# Patient Record
Sex: Female | Born: 1960
Health system: Southern US, Community
[De-identification: ages and names within clinical notes are randomized; demographics above are authoritative.]

## PROBLEM LIST (undated history)

## (undated) DIAGNOSIS — O1420 HELLP syndrome (HELLP), unspecified trimester: Secondary | ICD-10-CM

## (undated) DIAGNOSIS — G8929 Other chronic pain: Secondary | ICD-10-CM

## (undated) DIAGNOSIS — D497 Neoplasm of unspecified behavior of endocrine glands and other parts of nervous system: Secondary | ICD-10-CM

## (undated) DIAGNOSIS — Z9889 Other specified postprocedural states: Secondary | ICD-10-CM

## (undated) DIAGNOSIS — R109 Unspecified abdominal pain: Secondary | ICD-10-CM

## (undated) HISTORY — PX: CHOLECYSTECTOMY: SHX55

## (undated) HISTORY — DX: Other specified postprocedural states: Z98.890

## (undated) HISTORY — DX: Unspecified abdominal pain: R10.9

## (undated) HISTORY — PX: OTHER SURGICAL HISTORY: SHX169

## (undated) HISTORY — PX: TONSILLECTOMY: SUR1361

## (undated) HISTORY — DX: HELLP syndrome (HELLP), unspecified trimester: O14.20

## (undated) HISTORY — DX: Other chronic pain: G89.29

---

## 1998-10-06 ENCOUNTER — Encounter: Payer: Self-pay | Admitting: Internal Medicine

## 1998-10-06 ENCOUNTER — Emergency Department (HOSPITAL_COMMUNITY): Admission: EM | Admit: 1998-10-06 | Discharge: 1998-10-06 | Payer: Self-pay | Admitting: Internal Medicine

## 2000-01-25 ENCOUNTER — Emergency Department (HOSPITAL_COMMUNITY): Admission: EM | Admit: 2000-01-25 | Discharge: 2000-01-25 | Payer: Self-pay | Admitting: Emergency Medicine

## 2000-09-11 ENCOUNTER — Ambulatory Visit (HOSPITAL_COMMUNITY): Admission: RE | Admit: 2000-09-11 | Discharge: 2000-09-11 | Payer: Self-pay | Admitting: Internal Medicine

## 2000-12-17 ENCOUNTER — Emergency Department (HOSPITAL_COMMUNITY): Admission: EM | Admit: 2000-12-17 | Discharge: 2000-12-17 | Payer: Self-pay | Admitting: *Deleted

## 2000-12-17 ENCOUNTER — Encounter: Payer: Self-pay | Admitting: *Deleted

## 2000-12-19 ENCOUNTER — Ambulatory Visit (HOSPITAL_COMMUNITY): Admission: RE | Admit: 2000-12-19 | Discharge: 2000-12-19 | Payer: Self-pay | Admitting: General Surgery

## 2001-05-13 ENCOUNTER — Ambulatory Visit (HOSPITAL_COMMUNITY): Admission: RE | Admit: 2001-05-13 | Discharge: 2001-05-13 | Payer: Self-pay | Admitting: Internal Medicine

## 2001-05-13 ENCOUNTER — Encounter: Payer: Self-pay | Admitting: Internal Medicine

## 2001-05-16 ENCOUNTER — Encounter: Admission: RE | Admit: 2001-05-16 | Discharge: 2001-08-14 | Payer: Self-pay | Admitting: Internal Medicine

## 2002-06-06 ENCOUNTER — Encounter: Payer: Self-pay | Admitting: Internal Medicine

## 2002-06-06 ENCOUNTER — Ambulatory Visit (HOSPITAL_COMMUNITY): Admission: RE | Admit: 2002-06-06 | Discharge: 2002-06-06 | Payer: Self-pay | Admitting: Internal Medicine

## 2002-06-12 ENCOUNTER — Ambulatory Visit (HOSPITAL_COMMUNITY): Admission: RE | Admit: 2002-06-12 | Discharge: 2002-06-12 | Payer: Self-pay | Admitting: Internal Medicine

## 2002-06-12 ENCOUNTER — Encounter: Payer: Self-pay | Admitting: Internal Medicine

## 2002-09-11 ENCOUNTER — Ambulatory Visit (HOSPITAL_COMMUNITY): Admission: RE | Admit: 2002-09-11 | Discharge: 2002-09-11 | Payer: Self-pay | Admitting: Internal Medicine

## 2002-09-11 ENCOUNTER — Encounter: Payer: Self-pay | Admitting: Internal Medicine

## 2003-01-28 ENCOUNTER — Emergency Department (HOSPITAL_COMMUNITY): Admission: EM | Admit: 2003-01-28 | Discharge: 2003-01-28 | Payer: Self-pay | Admitting: Emergency Medicine

## 2003-04-07 ENCOUNTER — Other Ambulatory Visit: Admission: RE | Admit: 2003-04-07 | Discharge: 2003-04-07 | Payer: Self-pay | Admitting: Gynecology

## 2003-07-11 ENCOUNTER — Inpatient Hospital Stay (HOSPITAL_COMMUNITY): Admission: AD | Admit: 2003-07-11 | Discharge: 2003-07-15 | Payer: Self-pay | Admitting: Gynecology

## 2003-07-11 ENCOUNTER — Encounter (INDEPENDENT_AMBULATORY_CARE_PROVIDER_SITE_OTHER): Payer: Self-pay | Admitting: *Deleted

## 2003-07-16 ENCOUNTER — Encounter: Admission: RE | Admit: 2003-07-16 | Discharge: 2003-08-15 | Payer: Self-pay | Admitting: Gynecology

## 2003-08-09 ENCOUNTER — Inpatient Hospital Stay (HOSPITAL_COMMUNITY): Admission: AD | Admit: 2003-08-09 | Discharge: 2003-08-09 | Payer: Self-pay | Admitting: Gynecology

## 2003-08-16 ENCOUNTER — Encounter: Admission: RE | Admit: 2003-08-16 | Discharge: 2003-09-15 | Payer: Self-pay | Admitting: Gynecology

## 2003-09-03 ENCOUNTER — Other Ambulatory Visit: Admission: RE | Admit: 2003-09-03 | Discharge: 2003-09-03 | Payer: Self-pay | Admitting: Gynecology

## 2003-09-16 ENCOUNTER — Encounter: Admission: RE | Admit: 2003-09-16 | Discharge: 2003-10-16 | Payer: Self-pay | Admitting: Gynecology

## 2003-11-16 ENCOUNTER — Encounter: Admission: RE | Admit: 2003-11-16 | Discharge: 2003-12-16 | Payer: Self-pay | Admitting: Gynecology

## 2004-02-11 ENCOUNTER — Ambulatory Visit (HOSPITAL_COMMUNITY): Admission: RE | Admit: 2004-02-11 | Discharge: 2004-02-11 | Payer: Self-pay | Admitting: Internal Medicine

## 2004-06-07 ENCOUNTER — Ambulatory Visit (HOSPITAL_COMMUNITY): Admission: RE | Admit: 2004-06-07 | Discharge: 2004-06-07 | Payer: Self-pay | Admitting: Internal Medicine

## 2004-08-25 ENCOUNTER — Ambulatory Visit (HOSPITAL_COMMUNITY): Admission: RE | Admit: 2004-08-25 | Discharge: 2004-08-25 | Payer: Self-pay | Admitting: Internal Medicine

## 2005-01-02 DIAGNOSIS — Z9889 Other specified postprocedural states: Secondary | ICD-10-CM

## 2005-01-02 HISTORY — DX: Other specified postprocedural states: Z98.890

## 2005-02-04 ENCOUNTER — Inpatient Hospital Stay (HOSPITAL_COMMUNITY): Admission: EM | Admit: 2005-02-04 | Discharge: 2005-02-08 | Payer: Self-pay | Admitting: Emergency Medicine

## 2005-02-14 ENCOUNTER — Ambulatory Visit: Payer: Self-pay | Admitting: Internal Medicine

## 2005-02-17 ENCOUNTER — Ambulatory Visit (HOSPITAL_COMMUNITY): Admission: RE | Admit: 2005-02-17 | Discharge: 2005-02-17 | Payer: Self-pay | Admitting: Internal Medicine

## 2005-02-28 ENCOUNTER — Ambulatory Visit (HOSPITAL_COMMUNITY): Admission: RE | Admit: 2005-02-28 | Discharge: 2005-02-28 | Payer: Self-pay | Admitting: Internal Medicine

## 2005-03-08 ENCOUNTER — Ambulatory Visit: Payer: Self-pay | Admitting: Internal Medicine

## 2006-08-26 IMAGING — CT CT PELVIS W/ CM
1 of 3 series · 14 of 32 positions shown, 19 images · IV contrast (CONTRAST)
Comparison: none

HISTORY: Right-sided abdominal pain, nausea, altered bowel habits

[Series 4719: — · axial · 0.65mm/px · z∈[+1118,+1504]mm · 14 of 89 slices shown, 19 images]
[im 6/89  soft-tissue]
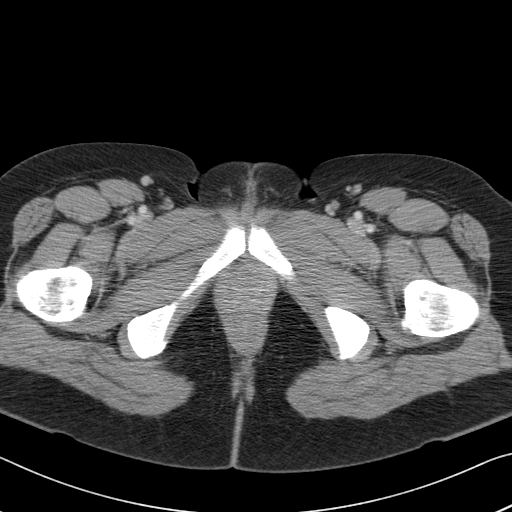
[im 6/89  bone]
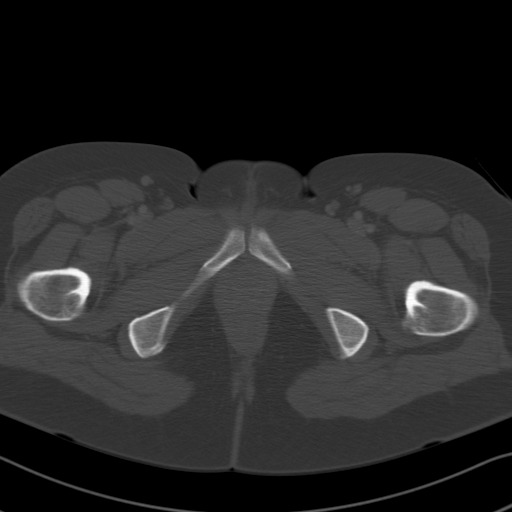
[im 11/89  soft-tissue]
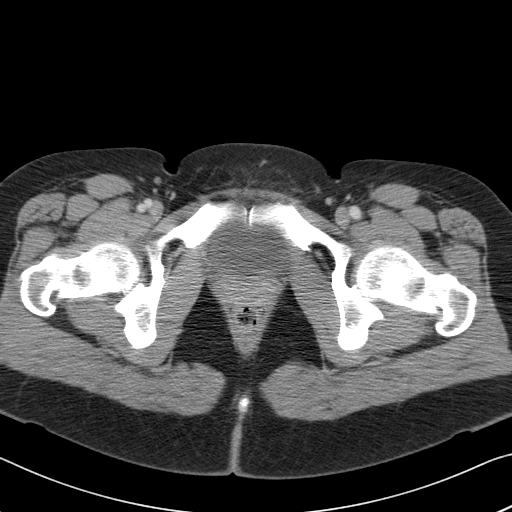
[im 21/89  soft-tissue]
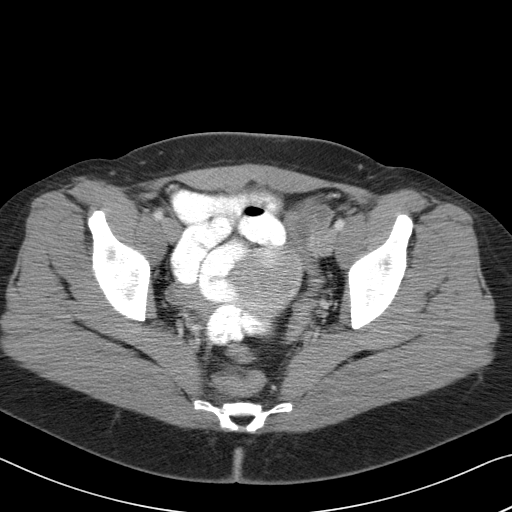
[im 26/89  soft-tissue]
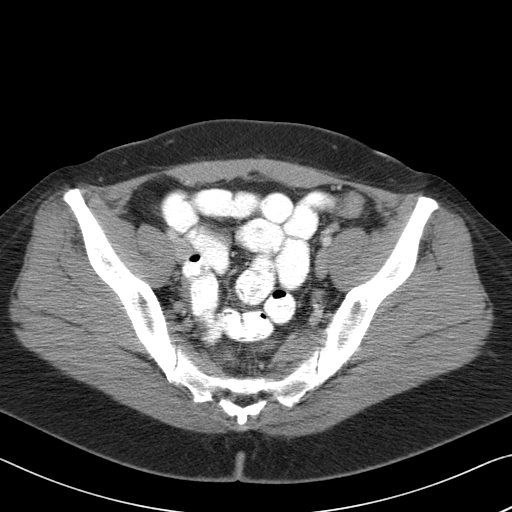
[im 32/89  soft-tissue]
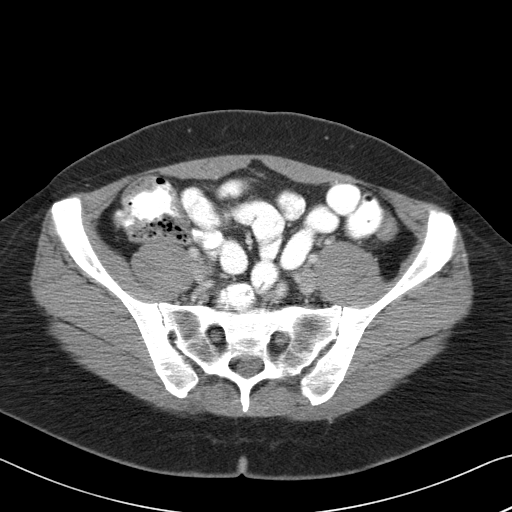
[im 37/89  soft-tissue]
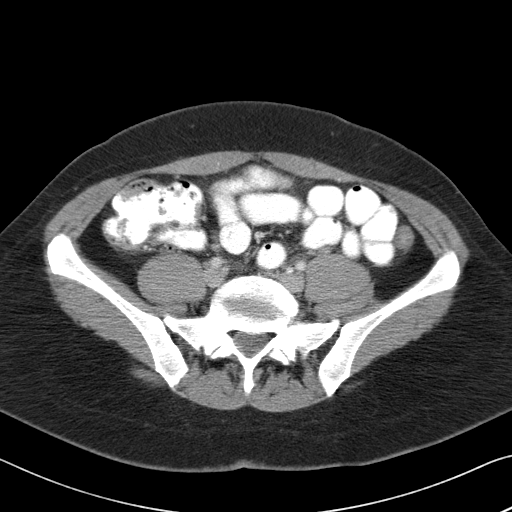
[im 47/89  soft-tissue]
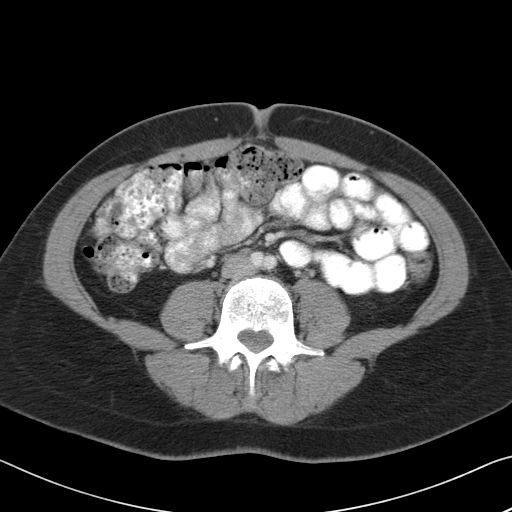
[im 52/89  soft-tissue]
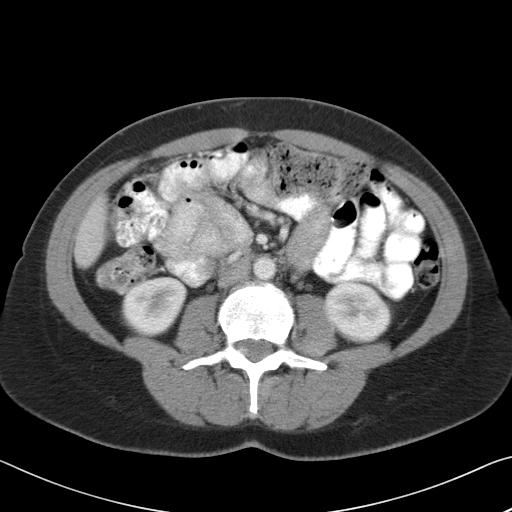
[im 57/89  soft-tissue]
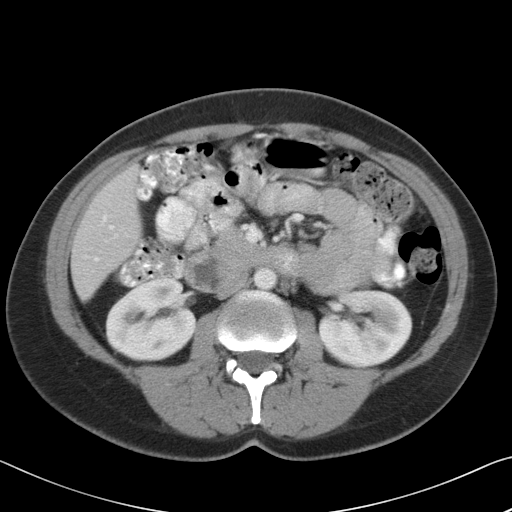
[im 57/89  bone]
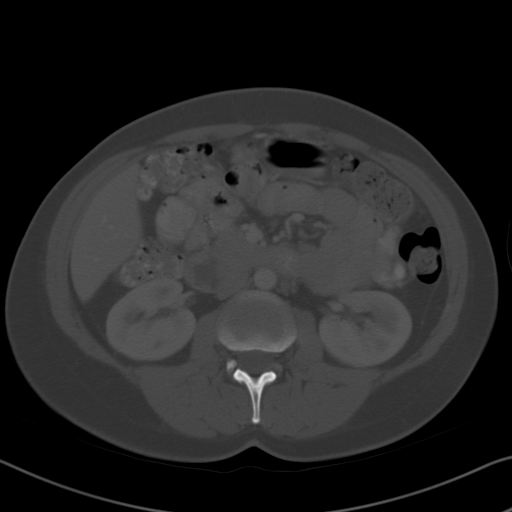
[im 63/89  soft-tissue]
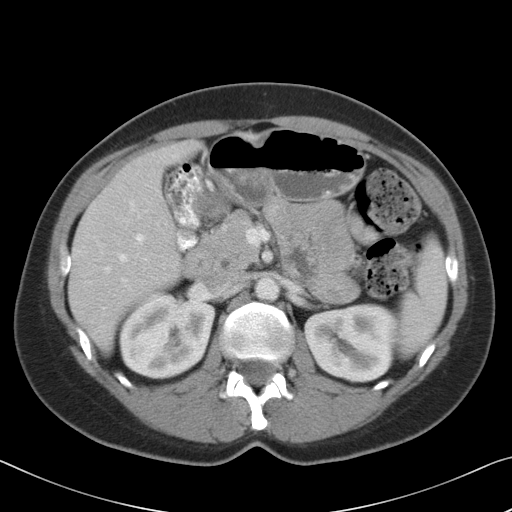
[im 68/89  soft-tissue]
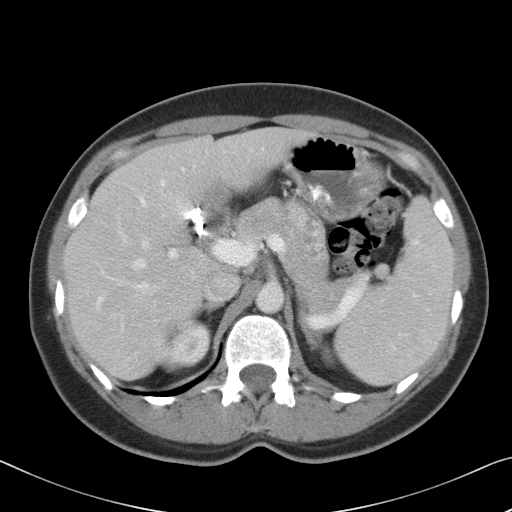
[im 68/89  lung]
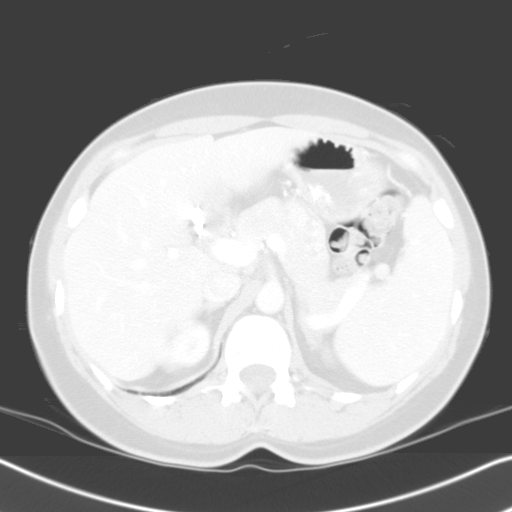
[im 73/89  lung]
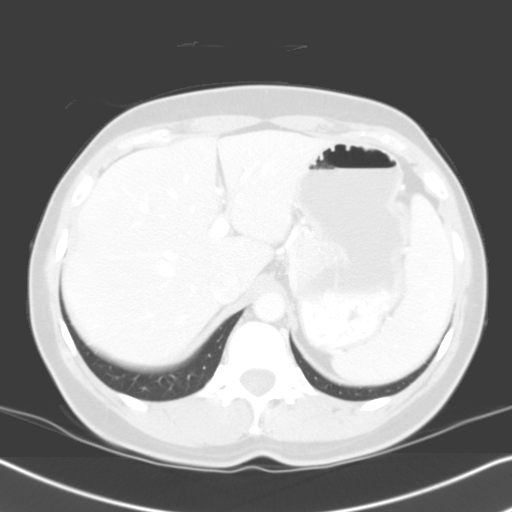
[im 78/89  soft-tissue]
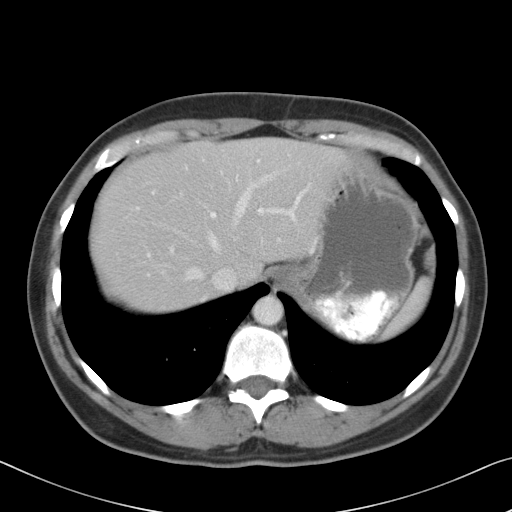
[im 78/89  lung]
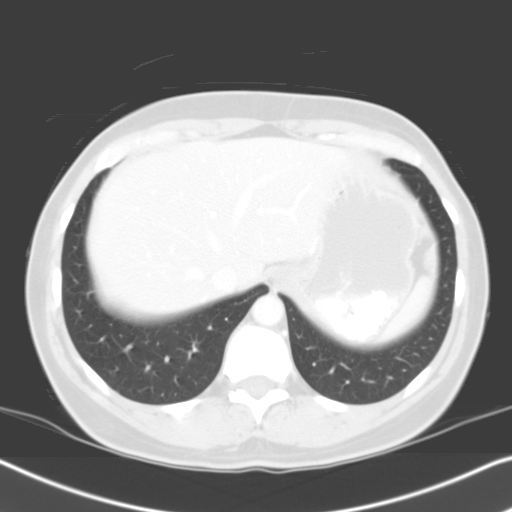
[im 83/89  soft-tissue]
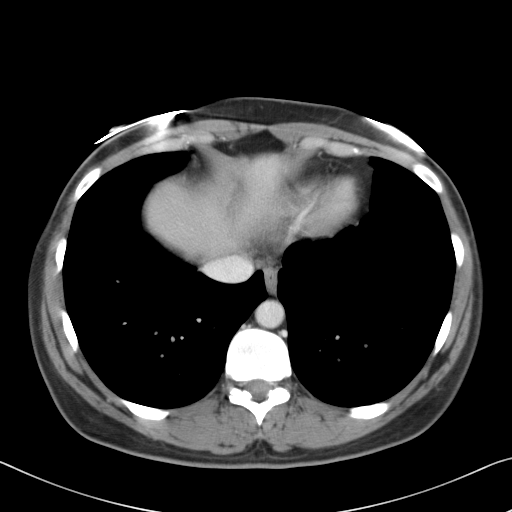
[im 83/89  lung]
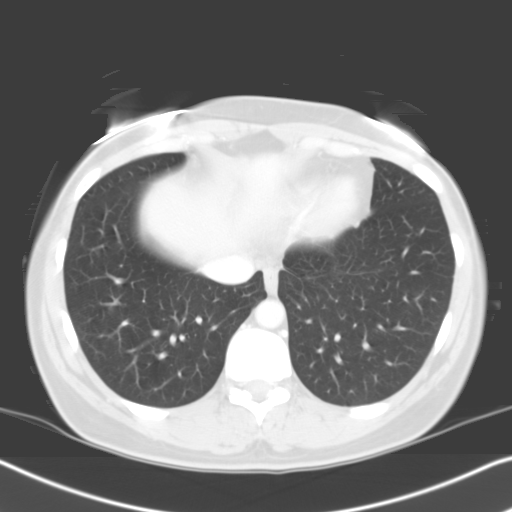

[14 of 32 positions shown; findings below may reference images not displayed]

CT ABDOMEN AND PELVIS WITH CONTRAST:

Multidetector helical CT imaging abdomen and pelvis performed.
Exam utilized dilute oral contrast and 100 cc Rmnipaque-033.
Comparison to [DATE]

CT ABDOMEN:

Lung bases clear.
Status post cholecystectomy.
3 mm diameter nonspecific low attenuation focus right lobe liver too small to
characterize, unchanged since previous study of 02/04/2005.
This also is questionably present on a noncontrast CT of 08/25/2004.
Remainder of liver, spleen, pancreas, kidneys, and adrenal glands normal.
Few scattered normal size lymph nodes throughout small bowel mesentery.
No enlarged upper abdominal lymph nodes.
No mass, free fluid, or inflammatory process.
Bowel loops normal.
IMPRESSION: Stable tiny nonspecific low attenuation focus liver.
Few nonspecific normal sized mesenteric lymph nodes of uncertain significance.
No other upper abdominal abnormalities.

CT PELVIS:

Small sclerotic focus in L5 vertebral body, nonspecific, stable since 08/25/2004.
Probable tiny bone islands in left ilium and bilateral femoral heads.
Additional tiny nonspecific sclerotic focus right pubic bone.
Air filled normal appendix extending cranially, lateral to ascending colon.
No pelvic mass, adenopathy, or free fluid.
Pelvic bowel loops normal appearance.
No hernia or acute pelvic inflammatory process. 
No distal ureteral calcification or dilatation.
Bladder unremarkable.
IMPRESSION: No acute intrapelvic abnormalities.

## 2007-10-09 ENCOUNTER — Ambulatory Visit: Payer: Self-pay | Admitting: Internal Medicine

## 2007-10-11 ENCOUNTER — Emergency Department (HOSPITAL_COMMUNITY): Admission: EM | Admit: 2007-10-11 | Discharge: 2007-10-11 | Payer: Self-pay | Admitting: Emergency Medicine

## 2007-10-15 ENCOUNTER — Ambulatory Visit (HOSPITAL_COMMUNITY): Admission: RE | Admit: 2007-10-15 | Discharge: 2007-10-15 | Payer: Self-pay | Admitting: Internal Medicine

## 2007-10-18 ENCOUNTER — Ambulatory Visit (HOSPITAL_COMMUNITY): Admission: RE | Admit: 2007-10-18 | Discharge: 2007-10-18 | Payer: Self-pay | Admitting: Internal Medicine

## 2007-10-22 ENCOUNTER — Ambulatory Visit: Payer: Self-pay | Admitting: Internal Medicine

## 2009-05-13 ENCOUNTER — Emergency Department (HOSPITAL_COMMUNITY): Admission: EM | Admit: 2009-05-13 | Discharge: 2009-05-13 | Payer: Self-pay | Admitting: Emergency Medicine

## 2010-05-17 NOTE — Assessment & Plan Note (Signed)
[name], [name]                 CHART#:  [id number]   DATE:  10/09/2007                       DOB:  August 16, 1960   CHIEF COMPLAINT:  Recurrent abdominal pain.   HISTORY OF PRESENT ILLNESS:  The patient is a pleasant 50 year old lady  seen previously by Dr. [name] followed primarily with Dr. Ouida Sills.  Her  gynecologist is Dr. Fulton Mole Bishropick down in [place], [place].  He comes to see me with a 61-month history of intermittent right upper  quadrant abdominal pain, which comes and goes.  It may last a couple of  hours, sometimes wakes her at night.  She also does had cutting razor  blade-type discomfort below her umbilicus, which occurred in time to  time.  Symptoms been intermittent over the past 3 months, not related to  bowel movements, which she states continue to be normal.  She has had  some nausea and vomiting with the right upper quadrant discomfort, which  occurs at other times and often independent of the infraumbilical  discomfort.  She denies melena, hematochezia, constipation, and  diarrhea.  Has 1-2 bowel movements daily.  Does not have any typical  reflux symptoms such as pyrosis/heartburn and belching.  No odynophagia,  no dysphagia, and no early satiety.  She has gained 13-1/2 pounds since  she was last seen here in 2007.  She differentiates her current symptoms  from the right lower quadrant abdominal pain in years past for which she  is underwent a significant evaluation here by Dr. [name] and Dr. [name], one of our former general surgeons.  She has had upper and lower  endoscopy.  Multiple CT scans, small bowel follow-through, and celiac  antibody panel are all unrevealing, although there was a question of  early appendicitis on prior CT, which was not borne out in subsequent  studies.   She does not use nonsteroidal agents.  He is basically not taking any  medications accept for vitamin C supplement and Alka-Seltzer Cold  Formula just recently for some  sinus congestion.   The patient tells me she has been under an amount of stress over the  past 3 months.   FAMILY HISTORY:  No family history of GI neoplasia.  Her gallbladder is  out (Dr. Lovell Sheehan' biliary dyskinesia in 2008).  She has had laparoscopic  procedures and endometriosis back in 1991, status post C-section in  2005, status post tonsillectomy and adenoidectomy.   PAST MEDICAL HISTORY:  Significant for endometriosis and chronic  abdominal pain.   PAST SURGICAL HISTORY:  As outlined.   MEDICATIONS:  Vitamin C and Alka-Seltzer Cold.   ALLERGIES:  Iodine (topical).   FAMILY HISTORY:  No chronic GI or liver illness.  Mother died at age 65  with lung problems and diabetes.  Father is alive, prostate cancer,  otherwise no history chronic GI or liver illness.   SOCIAL HISTORY:  The patient is married and has 51 child 60-year-old  daughter.  She is a Designer, fashion/clothing.  No tobacco and rarely  consumes alcohol.  No illicit drugs.   REVIEW OF SYSTEMS:  No night sweats, fever, chills, dyspnea, or chest  pain, otherwise as in history of present illness.   PHYSICAL EXAMINATION:  GENERAL:  A pleasant 47-year lady resting  comfortably.  VITAL SIGNS:  Weight 191,  height of 5 feet 6 inches, temperature 98.1,  BP 118/74, and pulse 68.  SKIN:  Warm and dry.  No jaundice.  No cutaneous stigmata of chronic  liver disease.  HEENT:  No scleral icterus.  Conjunctivae are pink.  CHEST:  Lungs are clear to auscultation.  CARDIAC:  Regular rate and rhythm without murmur, gallop, or rub.  ABDOMEN:  Nondistended, positive bowel sounds, soft.  She does have some  right upper quadrant right anterior rib cage tenderness to palpation and  has a very vague bilateral lower quadrant tenderness to palpation.  No  obvious mass, no rebound tenderness, and no organomegaly.  EXTREMITIES:  No edema.  RECTAL:  Good sphincter tone.  No mass. No stool in the rectal vault.  Hemoccult negative.    IMPRESSION:  The patient is a pleasant 50 year old lady with a 68-month  history of intermittent fleeting right upper quadrant abdominal pain and  infraumbilical cutting discomfort at 2 different areas of discomfort  seemed to occur independent of the other and not associated with eating  or bowel function symptoms, at this point are quite nonspecific,  especially we could be dealing with an element of post cholecystectomy  syndrome, a musculoskeletal etiology.  She tells me she had a battery of  labs done in [place] by her gynecologist back in July, but that is why  labs predate her current gastrointestinal symptoms.  She does have a  history of endometriosis, which certainly could be a recurrent problem  for her along with other diagnostic possibilities.   RECOMMENDATIONS:  We will go ahead and check lipase, CBC, and LFTs  today.  A pregnancy test and obtained abdominal ultrasound.  I have also  asked the patient to go ahead and try a probiotic empirically in the way  of [facility] daily.  For the time being, I would like  further recommendations in regards to the patient's GI symptoms in the  very near future.   She gives a history of only getting 4-5 hours of sleep nightly in a  routinely since the birth of her daughter.  This may not have anything  to do with her abdominal discomfort.  I told her it would be nice for  her to get least 6-7 hours of sleep if she can manage and then  summation.        Jonathon Bellows, M.D.  Electronically Signed     RMR/MEDQ  D:  10/09/2007  T:  10/09/2007  Job:  [id number]   cc:   Kingsley Callander. Ouida Sills, MD  Dr. Kari Baars

## 2010-05-17 NOTE — Assessment & Plan Note (Signed)
NAMECLARINDA, [name]                 CHART#:  [id number]   DATE:  10/22/2007                       DOB:  [birth year]   FOLLOWUP:  Abdominal pain.   The patient was seen here for evaluation of a right upper quadrant  abdominal pain and suprapubic infraumbilical cutting pain and back on  10/09/2007 this exacerbation of symptoms she has had over the years.  Since she was seen on 10/09/2007, she has not had any more  infraumbilical/suprapubic discomfort.  She has had a couple episodes of  right upper quadrant pain that is doubled her over and has had some  nausea and vomiting associated with her symptoms for which I have called  in.  I have called in Phenergan tablets with good relief of nausea  symptoms.  She has had an ultrasound and contrast CT of the abdomen  since her last visit, both of which were normal study, status post  cholecystectomy.  There is no biliary dilation.  Moreover her CBC from  10/14/2007 was normal as was her hepatic profile, lipase, and pregnancy  test normal and were negative including CBC, LFTs, lipase, and serum  pregnancy test.   She has been taking her [employer] daily and tells me that  she thinks this is helping with her infraumbilical discomfort.  She is  having 1 bowel movement daily.  Denies constipation, diarrhea, or  bleeding.  Bowel function does not marches through and does not affect  the above-mentioned abdominal discomfort in any way whatsoever.  She  incidentally went to the emergency room with an allergic rash, which was  treated, apparently incidentally not related to any medicinal regimen.  In retrospect, she tells me that the right upper quadrant pain she has  been having is very much akin to the pain that she had which led up to  getting her gallbladder out previously.   CURRENT MEDICATIONS:  See updated list.   ALLERGIES:  Iodine topical.   PHYSICAL EXAMINATION:  GENERAL:  Today, she appears in no acute  distress.  VITAL  SIGNS:  Weight 190, height 5 feet 6 inches, temperature 97.9, BP  100/70, and pulse 76.  SKIN:  Warm and dry.  ABDOMEN:  Flat, positive bowel sounds, entirely soft, and nontender  without appreciable mass or organomegaly.   ASSESSMENT:  Intermittent right upper quadrant abdominal pain, which may  more likely fall in the realm of post cholecystectomy syndrome,  specifically an element of sphincter of Oddi dysfunction, which in the  setting maybe more of a motility disorder than anything else.  Infraumbilical abdominal discomfort at least for now had improved with  probiotic therapy.  She does have a history endometriosis and the  diagnosis of chronic appendicitis has been entertained previously, but  her current clinical status suggested no further evaluation, but those  possibilities be undertaken at this time.   RECOMMENDATIONS:  1. Levsin sublingually 0.125 mg a.c. and nightly p.r.n. attacks for      abdominal pain.  2. I have asked her to please return 3 mailing hemoccult cards, which      to this point she has not done.  3. I will plan to see this nice lady back in 6 weeks.  If she      significantly do not continue to improve, we would consider  tertiary referral however, I feel that management options may be      somewhat limited.  I reassured the patient as to the apparent      benign, but chronic nature of her GI symptoms.       Jonathon Bellows, M.D.  Electronically Signed     RMR/MEDQ  D:  10/22/2007  T:  10/22/2007  Job:  [id number]   cc:   Sara Callander. Ouida Sills, MD

## 2010-05-20 NOTE — Consult Note (Signed)
Sara, Pitts                ACCOUNT NO.:  [id number]   MEDICAL RECORD NO.:  [id number]          PATIENT TYPE:  AMB   LOCATION:  DAY                           FACILITY:  [facility]   PHYSICIAN:  Lionel December, M.D.    DATE OF BIRTH:  Mar 31, 1960   DATE OF CONSULTATION:  02/14/2005  DATE OF DISCHARGE:                                   CONSULTATION   GASTROENTEROLOGY CONSULTATION:   DATE OF CONSULTATION:  February 14, 2005.   REASON FOR CONSULTATION:  Right-sided abdominal pain.   PHYSICIAN REQUESTING CONSULTATION:  Dr. [name].   HISTORY OF PRESENT ILLNESS:  [name] is a 50 year old Caucasian female patient  of Dr. [name] who presents today for further evaluation of chronic  intermittent right-sided abdominal pain.  She states the pain began about a  year ago.  Initially she thought it may be complications from her prior  cesarean section which was done in July 2005.  Initially her workup included  an abdominal ultrasound which was negative from February 2006.  She also had  documented normal LFTs and normal amylase.  She recalls having abnormal LFTs  while she was pregnant due to HELLP syndrome.  Her symptoms have been quite  sporadic.  It initially started on February 2006.  She will develop right  upper quadrant abdominal pain which radiates into the right lower quadrant.  It is associated with nausea and vomiting.  It feels like it did when she  had her gallbladder problems.  She is status post cholecystectomy with no  cholelithiasis.  In addition, she may have either constipation or diarrhea  with the symptoms.  She has been noted to have microscopic hematuria and has  been evaluated for possible kidney stones.  In August 2006, she had a  noncontrast study which revealed left lower pole renal calculi without  hydronephrosis.  She had another bout of abdominal pain in August and then  again in December (February 2007 was her last episode).  She ended up in the  emergency  department.  Initially she had a large amount of blood noted in  her urine although this was felt to be due to her menstrual cycle according  to the patient.  She had a hemoglobin 14.2 on presentation, 11.7 on February 06, 2005; white count of 4000 and platelet count of 138,000 on February 06, 2005.  LFTs were normal.  Helicobacter pylori was negative.  She was  evaluated by Dr. [name] because of abnormalities seen on a CT scan.  She had mild thickening of the proximal duodenal wall but no paraduodenal  inflammation.  The appendiceal tip was borderline dilated measuring 7 mm and  there was felt to be slight thickening of the lateral conal fascia adjacent  to the appendiceal tip.  The patient states that it was ruled out that she  did not have appendicitis.  She did end up with upper and lower endoscopy  but she states that she was told she had gastritis but otherwise she is not  aware of the results.  She was put  on Prevacid 30 mg daily, Reglan 10 mg  before every meal, at bedtime, p.r.n. and hyoscyamine 0.125 mg before every  meal, at bedtime, p.r.n.  She states this is the longest episode she has  ever had.  She is now approximately 10 days into having pain and diarrhea.  She notes however that her abdominal pain has lessened quite a bit.  Generally when she has had these episodes in the past it may last one or two  days.  On a regular basis she is prone to constipation but she is still  having some diarrhea.  Denies any melena or hematochezia.   CURRENT MEDICATIONS:  1.  Multivitamin daily.  2.  Prevacid 30 mg daily.  3.  Reglan 10 mg before every meal, at bedtime, p.r.n.  4.  Levsin 0.125 mg before every meal, at bedtime, p.r.n.   ALLERGIES:  TOPICAL IODINE.   PAST MEDICAL HISTORY:  Negative for major illnesses.   PAST SURGICAL HISTORY:  Diagnostic laparoscopy for endometriosis,  tonsillectomy, laparoscopic cholecystectomy December 2002, cesarean section  July 2005,  colonoscopy and EGD during recent hospitalization (we do not have  the official results).   FAMILY HISTORY:  Mother died at age 22 had diabetes, heart and lung disease.  Father has been treated for prostate cancer and is doing well.  No family  history of colorectal cancer.   SOCIAL HISTORY:  She is married and has one daughter.  She is employed at  the post [employer].  She has never been a smoker.  She rarely consumes wine but  no chronic alcohol use.   PHYSICAL EXAMINATION:  VITAL SIGNS:  Weight 169.5, height 5 feet 6 inches,  temperature 98.4, blood pressure 110/70, pulse 96.  GENERAL:  Pleasant, well-nourished, well-developed Caucasian female in no  acute distress.  SKIN:  Warm and dry.  No jaundice.  HEENT:  Sclerae nonicteric.  Oropharyngeal mucosa moist and pink.  No  lesions, erythema, or exudate.  No lymphadenopathy or thyromegaly.  CHEST:  Lungs are clear to auscultation.  Cardiac exam reveals regular rate  and rhythm, normal S1, S1, no murmurs, rubs, or gallops.  ABDOMEN:  Positive bowel sounds, soft, nondistended.  She has mild to  moderate abdominal pain in the entire right abdomen.  No rebound tenderness  or guarding.  No abdominal bruits or hernias.  No CVA tenderness.  EXTREMITIES:  No edema.   IMPRESSION:  The patient is a 50 year old Caucasian female with 1-year  history of intermittent episodes of right-sided abdominal pain associated  with nausea, vomiting, and either constipation or diarrhea.  It is the  longest.  It has also been the most severe episode.  She has had a  noncontrast CT in August 2006 which revealed some left nephrolithiasis but  no urethral stones.  Most recent contrast CT showed some duodenitis and  questionable findings of the appendix although Dr. [name] felt that she  did not have any appendicitis.  She denies any urinary symptoms.  This would  make nephrolithiasis less likely the possibility.  Symptoms are somewhat atypical for irritable  bowel syndrome and obstruction, inflammatory bowel  disease (less likely), sphincter of Oddi dysfunction.  Interestingly she  also has pain, question the possibility of viral illness with current  episode.   PLAN:  1.  Recheck her CBC.  2.  Small-bowel follow-through.  3.  Obtain EGD and colonoscopy records.  4.  Continue current medical regimen.  5.  We will determine if she had  any stool studies during recent      hospitalization.  6.  Further recommendations to follow.   Thank you for allow [place] to take part in the care of this patient.      Tana Coast, P.A.      Lionel December, M.D.  Electronically Signed    LL/MEDQ  D:  02/14/2005  T:  02/14/2005  Job:  [id number]   cc:   Kingsley Callander. Ouida Sills, MD  Fax: [phone]

## 2010-05-20 NOTE — Discharge Summary (Signed)
[name], [name]                ACCOUNT NO.:  [id number]   MEDICAL RECORD NO.:  [id number]          PATIENT TYPE:  INP   LOCATION:  A311                          FACILITY:  [facility]   PHYSICIAN:  Dirk Dress. [name], M.D.   DATE OF BIRTH:  1960-06-12   DATE OF ADMISSION:  02/04/2005  DATE OF DISCHARGE:  02/07/2007LH                                 DISCHARGE SUMMARY   DISCHARGE DIAGNOSIS:  Acute gastritis.   SPECIAL PROCEDURE:  Esophagogastroduodenoscopy and total colonoscopy  February 07, 2005.   DISPOSITION:  The patient discharged home in stable improved condition on  February 7.   DISCHARGE MEDICATIONS:  1.  Levsin 0.25 mg before meals and bedtime.  2.  Phenergan 25 mg every 4 hours as needed.  3.  Tylox one every 4 hours as needed.  4.  Prevacid 30 mg daily.  5.  Reglan 10 mg before meals and at bedtime.   The patient is scheduled to see Dr. [name] in 10 days and will see me in  the office as needed.   SUMMARY:  A 50 year old female admitted for evaluation of right-sided  abdominal pain with recurrent episodes of nausea, vomiting, diarrhea.  The  patient states that she developed severe pain about noon on February 2.  The  pain was invariably in the right upper quadrant, right lower quadrant, right  flank and back.  She had nausea and vomiting about seven times and had seven  episodes of severe explosive diarrhea.  The pain was unrelenting after onset  and did not improve until she received Dilaudid in the emergency room.  She  had some relief.  When seen she complained of epigastric and right upper  quadrant pain and right lower quadrant pain.  She did not have fever or  chills.  She did not have leukocytosis.  CT scan showed minimal enlargement  of the tip of appendix and the remainder of the appendix was normal with no  periappendiceal swelling or inflammation.  She states that she has had  similar symptoms off and on for at least 8 months.  It was felt that the  findings  did not correlate with appendicitis, so it was elected to treat the  patient symptomatically, check her for C. difficile, H. pylori, and do  endoscopic evaluation.  The patient responded to this treatment.  Her diet  was advanced on February 3.  Her diarrhea had improved by the following day.  She had EGD and colonoscopy on February 6.  EGD showed acute gastritis  without duodenitis.  Colonoscopy was totally normal to the cecum.  She  remained stable.  She was advanced to a regular diet which she tolerated  well.  She remained afebrile and was discharged home on February 7 to have  routine followup with Dr. Ouida Sills.      Dirk Dress. [name], M.D.  Electronically Signed     LCS/MEDQ  D:  04/01/2005  T:  04/03/2005  Job:  [id number]   cc:   Kingsley Callander. Ouida Sills, MD  Fax: [phone]

## 2010-05-20 NOTE — Discharge Summary (Signed)
NAME:  [name], [name]                          ACCOUNT NO.:  [id number]   MEDICAL RECORD NO.:  [id number]                   PATIENT TYPE:  INP   LOCATION:  9132                                 FACILITY:  [facility]   PHYSICIAN:  Timothy P. Fontaine, M.D.           DATE OF BIRTH:  [birth year]   DATE OF ADMISSION:  07/11/2003  DATE OF DISCHARGE:  07/15/2003                                 DISCHARGE SUMMARY   DISCHARGE DIAGNOSES:  1. Pregnancy at 34 weeks, delivered.  2. Severe preeclampsia with hemolysis, elevated liver enzymes, and low     platelet count syndrome.   PROCEDURE:  Primary low transverse cervical cesarean section, James A.  Ashley Royalty, M.D., July 11, 2003.   HOSPITAL COURSE:  A 50 year old, G1, P0 at [redacted] weeks gestation admitted  complaining of epigastric pain.  Initial laboratory evaluation revealed  elevated liver functions consistent with HELLP syndrome and a blood pressure  in the 114-160/77-95 diastolic. The patient was diagnosed with severe  preeclampsia with HELLP syndrome and underwent a primary cesarean section  per Dr. Sylvester Harder.  The patient produced a normal appearing female,  Apgar's 7 & 7, weight 2256 g.  The patient's postoperative course was  uncomplicated and she was discharged on postoperative day #4 and doing well  tolerating a regular diet with a postoperative hemoglobin of 10.3.  The  patient's liver functions were noted to be improving at the time of  discharge and her SGOT was 69, SGPT 188, LDH 225, platelets 148,000.  The  patient received precaution instructions in followup and will be seen in the  office in six weeks and received a prescription for Tylox #20 for pain.                                               Timothy P. Audie Box, M.D.    TPF/MEDQ  D:  08/04/2003  T:  08/05/2003  Job:  [id number]

## 2010-05-20 NOTE — Consult Note (Signed)
NAME:  [name], [name]                          ACCOUNT NO.:  [id number]   MEDICAL RECORD NO.:  [id number]                   PATIENT TYPE:  EMS   LOCATION:  ED                                   FACILITY:  [facility]   PHYSICIAN:  Langley Gauss, M.D.                DATE OF BIRTH:  03/21/1960   DATE OF CONSULTATION:  01/28/2003  DATE OF DISCHARGE:  01/28/2003                                   CONSULTATION   EMERGENCY ROOM CONSULTATION   REQUESTING PHYSICIAN:  Dr. Greta Doom, [facility] emergency room  physician.   REASON FOR CONSULTATION:  [name] is a 50 year old gravida 1 para 0  noted to be a high-risk pregnancy secondary to advanced maternal age who  comes in the emergency room tonight stating at work she had gush of bright  red bleeding into her undergarments.  Subsequently had minimal amount of  uterine cramping, and upon arrival here was noted to have minimal bleeding.  This is the first episode of vaginal bleeding during this pregnancy.  She is  noted to be Rh positive blood type.  The patient's prenatal course shows she  is followed by a physician in [place], [place] who provides  obstetrical care.  She did undergo the CVS procedure in [place] 2 days  previously.  The procedure was without complications.  The patient was given  standardized instructions at time of discharge regarding cramping, bleeding,  and avoidance of intercourse.   PAST MEDICAL HISTORY:  She does have a history of endometriosis, did undergo  laparoscopy with laser cauterization of implants.   PHYSICAL EXAMINATION:  GENERAL:  She is noted to be in no acute distress but  is very apprehensive regarding the bleeding noted.  ABDOMEN:  Soft and nontender.  Laparoscopy incision is identified.  The area  of the transabdominal sampling is noted just below the midpoint of the area  between pubis and umbilicus.  No significant bruising is identified.  PELVIC:  Examination per Dr. Colon Branch had revealed  just a trace of vaginal  bleeding.  Cervix was noted to be closed.  No cervical lesions were  identified.   I was requested to see the patient.  A transabdominal ultrasound performed  by Dr. Roylene Reason. [name], less than [redacted] weeks gestation, reveals a single  intrauterine pregnancy, a good fetal movement is identified, normal amniotic  fluid is noted around the baby.  Fetal cardiac activity is noted with a rate  of 150-160.   LABORATORY STUDIES:  Most pertinently, the patient is Rh positive blood  type.  A quantitative beta hCG was requested by the emergency room  physician.   ASSESSMENT:  A 50 year old patient 48 hours status post chorionic villi  sampling with single episode of vaginal bleeding this p.m.  Cervix is noted  to be closed upon examination and the viability of the pregnancy is  confirmed by transabdominal ultrasound.  The patient is now discharged home  at this time.  She will follow up with her OB/GYN  M.D. as previously recommended.  She is advised that should she become  concerned regarding the well-being of the pregnancy, cramping intensify, or  vaginal bleeding recur, she should contact us at which time a transabdominal  ultrasound could easily be performed again to ascertain the viability of the  pregnancy.      ___________________________________________                                            Langley Gauss, M.D.   DC/MEDQ  D:  01/28/2003  T:  01/29/2003  Job:  [id number]   cc:   Greta Doom, M.D.  [facility]

## 2010-05-20 NOTE — Op Note (Signed)
NAME:  [name], [name]                          ACCOUNT NO.:  [id number]   MEDICAL RECORD NO.:  [id number]                   PATIENT TYPE:  INP   LOCATION:  9374                                 FACILITY:  [facility]   PHYSICIAN:  Rudy Jew. Ashley Royalty, M.D.             DATE OF BIRTH:  1960-05-30   DATE OF PROCEDURE:  07/11/2003  DATE OF DISCHARGE:                                 OPERATIVE REPORT   PREOPERATIVE DIAGNOSES:  1. Intrauterine pregnancy at 34 weeks 4 days gestation.  2. HELLP (hemolysis, elevated liver enzymes, and low platelet count)     syndrome.   POSTOPERATIVE DIAGNOSES:  1. Intrauterine pregnancy at 34 weeks 4 days gestation.  2. HELLP (hemolysis, elevated liver enzymes, and low platelet count)     syndrome.   PROCEDURES:  Primary low transverse cesarean section   SURGEON:  James A. Ashley Royalty, M.D.   ANESTHESIA:  Spinal.   FINDINGS:  2256 g (4 pound 4 ounce) female. Apgars 7 at 1 minute and 7 at 5  minutes.  Sent to neonatal intensive care unit.  Arterial cord pH 7.19.   ESTIMATED BLOOD LOSS:  900 ml.   COMPLICATIONS:  None.   PACKS AND DRAINS:  Foley.  Sponge and needle instrument count recorded as  correct x 2.   DESCRIPTION OF PROCEDURE:  The patient was taken to the operating room and  placed in the sitting position.  After spinal anesthetic was administered,  she was placed in the dorsal supine position and prepped and draped in the  usual manner for abdominal surgery.  A Foley catheter was placed.  A  Pfannenstiel incision was made down to the level of the fascia which was  nicked with a knife and incised transversely with Mayo scissors.  The  underlying rectus muscles were separated from the fascia using sharp and  blunt dissection.  The rectus muscles were separated in the midline exposing  the peritoneum which was elevated with hemostats and entered atraumatically  with Metzenbaum scissors.  The incision was extended longitudinally.  The  uterus was  identified and bladder flap created by incising the anterior  uterine serosa and sharply and bluntly dissecting the bladder inferiorly.  It was held in place with the bladder blade.  The uterus was then entered  through a low transverse incision using sharp and blunt dissection.  The  fluid was noted to be clear.  The infant was delivered from a vertex  presentation in an atraumatic manner.  The infant was suctioned.  The cord  was triply clamped, cut, and the infant given immediately to the awaiting  pediatrics team.  Arterial cord pH was obtained from an isolated segment of  cord.  Regular cord blood was obtained, and the placenta and membranes  removed and sent to pathology for histologic studies.  The uterus was  exteriorized.  The uterus was then closed in two running layers of #  1  Vicryl.  The first was a running locking layer.  The second one was a  running, intermittently locking, and imbricating layers.  One additional  figure-of-eight suture was required to obtain hemostasis.  Hemostasis was  noted.  The uterus, tubes, and ovaries were inspected and found to be  otherwise normal and returned to the abdominal cavity.  Copious irrigation  was accomplished.  Hemostasis was again noted.  Next the rectus muscles were  reapproximated in the midline using 0 Vicryl suture.  The fascia was then  closed with 0 Vicryl in a running fashion.  The skin was closed with  staples.  The patient tolerated the procedure extremely well and was  returned to the recovery room in good condition.  At the conclusion of the  procedure, the urine was clear.                                               James A. Ashley Royalty, M.D.    JAM/MEDQ  D:  07/11/2003  T:  07/12/2003  Job:  [id number]

## 2010-05-20 NOTE — H&P (Signed)
[facility]  Patient:    [name], [name] Visit Number: [id number] MRN: [id number]          Service Type: EMS Location: ED Attending Physician:  Rosalyn Charters Admit Date:  12/17/2000 Discharge Date: 12/17/2000   CC:         [name], M.D.   History and Physical  AGE:  50 years old  CHIEF COMPLAINT:  Right upper quadrant pain and nausea.  HISTORY OF PRESENT ILLNESS:  The patient is a 50 year old white female who is referred for an evaluation and treatment of right upper quadrant abdominal pain and nausea.  She has been having right upper quadrant abdominal pain with radiation to the right flank and shoulder, nausea, bloating, and fatty food intolerance since last week.  It is now almost constant in nature, and she has been on a bland diet.  No fever, chills, jaundice, or history of peptic ulcer disease is noted.  No history of constipation or irritable bowel disease.  PAST MEDICAL HISTORY:  Unremarkable.  PAST SURGICAL HISTORY 1. Laparoscopy. 2. Tonsillectomy.  CURRENT MEDICATIONS:  None.  ALLERGIES:  IODINE.  REVIEW OF SYSTEMS:  The patient denies smoking or significant alcohol use. She denies any other cardiopulmonary difficulties or bleeding disorders.  PHYSICAL EXAMINATION  GENERAL:  The patient is a well-developed, well-nourished white female, in no acute distress.  VITAL SIGNS:  She is afebrile.  Vital signs are stable.  HEENT:  No scleral icterus.  LUNGS:  Clear to auscultation with equal breath sounds bilaterally.  HEART:  A regular rate and rhythm, without S3, S4, or murmurs.  ABDOMEN:  Soft, nondistended.  She is tender in the right upper quadrant to palpation.  No hepatosplenomegaly, masses, or herniae are identified.  An ultrasound of the gallbladder is negative.  Liver enzyme tests are within normal limits.  IMPRESSION:  Biliary colic, secondary to chronic cholecystitis.  PLAN:  The patient is scheduled for a  laparoscopic cholecystectomy on December 19, 2000.  The risks and benefits of the procedure, including bleeding, infection, hepatobiliary injury, and the possibility of an open procedure were fully explained to the patient, who gave an informed consent. Attending Physician:  Rosalyn Charters DD:  12/18/00 TD:  12/18/00 Job: [id number]

## 2010-05-20 NOTE — H&P (Signed)
[name], [name]                ACCOUNT NO.:  [id number]   MEDICAL RECORD NO.:  [id number]          PATIENT TYPE:  INP   LOCATION:  A311                          FACILITY:  [facility]   PHYSICIAN:  Dirk Dress. [name], M.D.   DATE OF BIRTH:  1960-08-24   DATE OF ADMISSION:  02/04/2005  DATE OF DISCHARGE:  LH                                HISTORY & PHYSICAL   A 50 year old female admitted for evaluation of right sided abdominal pain  with recurrent episodes of nausea, vomiting, and diarrhea.  The patient  states that she developed severe pain about 12:30, on February 03, 2005, and  the pain was invariably in the right upper quadrant, right lower quadrant,  right flank, and back.  She had nausea and vomiting about seven times and  had about seven episodes of severe explosive diarrhea.  She also noted high  pitched grumbling sounds in her abdomen.  She denies hematemesis.  The pain  was unrelenting after onset and did not improve until she received Dilaudid  in the emergency room.  She had some relief, but she now complains of more  epigastric and right upper quadrant pain and right lower quadrant pain.  The  patient has not had fever or chills.  She does not have leukocytosis and her  abdominal CT shows minimal enlargement of the tip of the appendix and the  remainder of the appendix is normal with no periappendiceal swelling or  inflammation.  She has had similar pains for at least eight months but not  quite as severe as this time.  Her findings are not suggestive of  appendicitis.  She does have thickening of the duodenal wall on CT which  suggests duodenitis.  This will need to be treated and evaluated further.   PAST HISTORY:  She has no major medical illness.   She takes no medications.   SURGERY:  1.  Diagnostic laparoscopy for endometriosis.  2.  Tonsillectomy.  3.  Laparoscopic cholecystectomy, December 2002.  4.  C-section, July 2005.   ALLERGIES:  IODINE including IV CONTRAST  DYE and BETADINE.   SOCIAL HISTORY:  She is married, the mother of one.  She is employed with  the [employer].  She does not drink, smoke, or use drugs.   PHYSICAL EXAMINATION:  GENERAL:  The patient is resting comfortably from  analgesics.  VITAL SIGNS:  Blood pressure 118/63, pulse 120, respirations 16, temperature  99.2, weight 171 pounds.  HEENT:  Unremarkable.  NECK:  Supple.  No JVD, bruit, adenopathy, or thyromegaly.  CHEST:  Clear to auscultation.  No rales, rubs, rhonchi, or wheezes.  HEART:  Regular rate and rhythm without murmur, gallop or rub.  She does  have tachycardia.  ABDOMEN:  Nondistended.  She has active bowel sounds.  She has moderate  epigastric and right upper quadrant tenderness with mild right lower  quadrant tenderness to palpation in the deep right lower quadrant.  There  are no masses or fullness.  There is no rebound.  EXTREMITIES:  No cyanosis, clubbing, or edema.  Normal joints.  Full pulses.  NEUROLOGIC:  No evidence of focal motor, sensory, or cerebellar deficit.  Cranial nerves are intact.   IMPRESSION:  1.  Acute exacerbation of abdominal pain with nausea, vomiting, diarrhea,      probably due to acute gastroenteritis.  This will need to be further      evaluated.  2.  Epigastric and right upper quadrant pain in a patient with a prior      history of cholecystectomy but with CT evidence of thickening of the      first portion of the duodenum, must consider gastritis and duodenitis.  3.  Right lower quadrant pain with normal appendix.  No periappendiceal      inflammation, no fever or leukocytosis, doubt appendicitis at this time      since most of the appendix deal with liquid and air and there is no      thickening of the wall of the appendix.   RECOMMENDATIONS:  1.  The patient is admitted.  2.  She will have IV fluids.  3.  We will allow her to take oral liquids since her nausea and vomiting has      improved.  4.  We will continue  to treat her symptomatically.  5.  Stool will be checked for routine culture and C. difficile medicine will      be given.  We will also check H. pylori antigen.  6.  We will recheck her labs in the morning.  7.  We will follow her temperature curve closely.  8.  I will consider EGD and colonoscopy during this admission, but if she      rapidly improves we will schedule this as an outpatient.      Dirk Dress. [name], M.D.  Electronically Signed     LCS/MEDQ  D:  02/04/2005  T:  02/04/2005  Job:  [id number]   cc:   Kingsley Callander. Ouida Sills, MD  Fax: [phone]

## 2010-05-20 NOTE — Op Note (Signed)
[facility]  Patient:    [name], [name] Visit Number: [id number] MRN: [id number]          Service Type: DSU Location: DAY Attending Physician:  Dalia Heading Dictated by:   Franky Macho, M.D. Proc. Date: 12/19/00 Admit Date:  12/19/2000   CC:         [name], M.D.   Operative Report  AGE:  50 years old.  PREOPERATIVE DIAGNOSIS:  Chronic cholecystitis.  POSTOPERATIVE DIAGNOSIS:  Chronic cholecystitis.  PROCEDURE:  Laparoscopic cholecystectomy.  SURGEON:  Franky Macho, M.D.  ANESTHESIA:  General endotracheal.  INDICATIONS:  Patient is a 50 year old white female who presents with biliary colic secondary to chronic cholecystitis.  The risks and benefits of the procedure including bleeding, infection, hepatobiliary injury, the possibility of an open procedure were fully explained to the patient, who gave informed consent.  DESCRIPTION OF PROCEDURE:  Patient was placed in the supine position.  After induction of general endotracheal anesthesia, the abdomen was prepped and draped using the usual sterile technique with Betadine.  A supraumbilical incision was made down to the fascia.  A Veress needle was introduced into the abdominal cavity and confirmation of placement was done using the saline drop test.  The abdomen was then insufflated to 16 mmHg pressure.  An 11-mm trocar was introduced into the abdominal cavity under direct visualization without difficulty.  The patient was placed in reversed Trendelenburg position and an additional 11-mm trocar was placed in the epigastric region and 5-mm trocars were placed in the right upper quadrant and right flank regions.  Liver was inspected and noted to be within normal limits.  The gallbladder was retracted superiorly and laterally.  The dissection was begun around the infundibulum of the gallbladder.  The cystic duct was first identified.  Its juncture to the infundibulum was  fully identified.  Endo Clips were placed proximally and distally on the cystic duct and the cystic duct was divided; this was likewise done to the cystic artery. The gallbladder was then freed away from the gallbladder fossa using Bovie electrocautery.  The gallbladder was delivered through the epigastric trocar site without difficulty.  The gallbladder fossa was inspected and no abnormal bleeding or bile leakage was noted.  Surgicel was placed in the gallbladder fossa.  The subhepatic space as well as right hepatic gutter were irrigated with normal saline.  All fluid and air were then evacuated from the abdominal cavity prior to removal of the trocars.  All wounds were irrigated with normal saline.  All wounds were injected with 0.5% Marcaine.  The supraumbilical fascia was reapproximated using a 4-0 Vicryl subcuticular suture.  Steri-Strips and dry sterile dressings were applied.  All tape and needle counts were correct at the end of the procedure. The patient was extubated in the operating room and went back to the recovery room awake and in stable condition.  COMPLICATIONS:  None.  SPECIMEN:  Gallbladder.  BLOOD LOSS:  Minimal. Dictated by:   Franky Macho, M.D. Attending Physician:  Dalia Heading DD:  12/19/00 TD:  12/20/00 Job: [id number]

## 2010-08-30 ENCOUNTER — Encounter: Payer: Self-pay | Admitting: Gastroenterology

## 2010-08-30 ENCOUNTER — Ambulatory Visit (INDEPENDENT_AMBULATORY_CARE_PROVIDER_SITE_OTHER): Payer: Federal, State, Local not specified - PPO | Admitting: Gastroenterology

## 2010-08-30 DIAGNOSIS — K625 Hemorrhage of anus and rectum: Secondary | ICD-10-CM | POA: Insufficient documentation

## 2010-08-30 DIAGNOSIS — R109 Unspecified abdominal pain: Secondary | ICD-10-CM | POA: Insufficient documentation

## 2010-08-30 NOTE — Patient Instructions (Signed)
Start taking Prilosec daily, 30 minutes before breakfast. Contact us if any side effects (headache)  We have set you up for a colonoscopy and endoscopy. Further recommendations will be made after this is completed.  In the interim, you may take a probiotic (samples provided) daily. You may get this over-the-counter as well.  Please have labs completed. We will be contacting you with the results as soon as they are available.

## 2010-08-30 NOTE — Progress Notes (Signed)
Referring Provider: No ref. provider found Primary Care Physician:  [name], MD Primary Gastroenterologist: Dr. [name]   Chief Complaint  Patient presents with  . Abdominal Pain    HPI:   [name] is a very pleasant, outgoing 50 year old Caucasian female who was last seen by our practice in October 2009. She has a history of chronic abdominal pain with a very thorough past work-up. She has had 2 colonoscopies as outlined in PMH, as well as an endoscopy by Dr. [name] in 2007. She has had multiple CT scans, SBFT, and celiac antibody panel, all of which were unrevealing. She had been doing well since the visit in October 2009. She reports vigilance regarding a high fiber diet, exercising, and drinking water.   However, she returns today with a several week history of epigastric pain described as a constant soreness, with waxing and waning sharp pains, associated with nausea and vomiting intermittently. Eating does not necessarily exacerbate this. She feels a pressure in the epigastric region. Denies chronic use of NSAIDs or aspirin powders. Denies reflux or dysphagia. She reports passing large clots per rectum, described as dark brown/pink/mucous. She states stool smells pungent, like "poison".  She reports a BM at least every day; however, she has unproductive bowel movements at times, with urgency to go and no bowel movement. She has small, round balls at times.   She has a good appetite, and her weight is fairly stable from the last visit a few years ago (was 191 in Oct 2009).     Past Medical History  Diagnosis Date  . Chronic abdominal pain   . S/P endoscopy 2007    Dr. [name]: gastritis  . S/P colonoscopy 2002, 2007    2002 Dr. [name]: small external hemorrhoids otherwise normal. 2007 Dr. [name]: normal to cecum  . HELLP (hemolytic anemia/elev liver enzymes/low platelets in pregnancy)     Past Surgical History  Procedure Date  . Cholecystectomy   . Cesarean  section   . Exploratory laparoscopy     endometriosis    Current Outpatient Prescriptions  Medication Sig Dispense Refill  . Multiple Vitamins-Minerals (MULTIVITAMIN WITH MINERALS) tablet Take 1 tablet by mouth daily.          Allergies as of 08/30/2010  . (No Known Allergies)    Family History  Problem Relation Age of Onset  . Colon cancer Neg Hx     History   Social History  . Marital Status: Married    Spouse Name: N/A    Number of Children: N/A  . Years of Education: N/A   Social History Main Topics  . Smoking status: Never Smoker   . Smokeless tobacco: None  . Alcohol Use: No  . Drug Use: None  . Sexually Active: None   Other Topics Concern  . None   Social History Narrative  . None    Review of Systems: Gen: Denies fever, chills, anorexia. Denies fatigue, weakness, weight loss.  CV: Denies chest pain, palpitations, syncope, peripheral edema, and claudication. Resp: Denies dyspnea at rest, cough, wheezing, coughing up blood, and pleurisy. GI: Denies vomiting blood, jaundice, and fecal incontinence.   Denies dysphagia or odynophagia. Derm: Denies rash, itching, dry skin Psych: Denies depression, anxiety, memory loss, confusion. No homicidal or suicidal ideation.  Heme: Denies bruising, bleeding, and enlarged lymph nodes.  Physical Exam: BP 119/75  Pulse 120  Temp(Src) 99 F (37.2 C) (Temporal)  Ht 5\' 6"  (1.676 m)  Wt 186 lb (84.369 kg)  BMI 30.02 kg/m2 General:   Alert and oriented. No distress noted. Pleasant and cooperative.  Head:  Normocephalic and atraumatic. Eyes:  Conjuctiva clear without scleral icterus. Mouth:  Oral mucosa pink and moist. Good dentition. No lesions. Neck:  Supple, without mass or thyromegaly. Heart:  S1, S2 present without murmurs, rubs, or gallops. Regular rate and rhythm. Abdomen:  +BS, soft, mildly tender epigastric region, and non-distended. No rebound or guarding. No HSM or masses noted. Msk:  Symmetrical without  gross deformities. Normal posture. Extremities:  Without edema. Neurologic:  Alert and  oriented x4;  grossly normal neurologically. Skin:  Intact without significant lesions or rashes. Cervical Nodes:  No significant cervical adenopathy. Psych:  Alert and cooperative. Normal mood and affect.

## 2010-08-30 NOTE — Assessment & Plan Note (Signed)
Several week hx of moderate-sized brown, bloody clots/pink mucous from rectum. She has undergone a colonoscopy in both 2002 and 2007 which were relatively unrevealing. Will be assessing UGI etiology via EGD; needs colonoscopy due to rectal bleeding, urgency. Will obtain baseline CBC for anemia. If no source of bleeding noted, may need capsule endoscopy.  Proceed with TCS with Dr. [name] in near future: the risks, benefits, and alternatives have been discussed with the patient in detail. The patient states understanding and desires to proceed.

## 2010-08-30 NOTE — Assessment & Plan Note (Signed)
50 year old with a history of chronic abdominal pain in the past, last seen in 2009, with a thorough work-up as outlined in HPI. She presents today with a new discomfort, described as epigastric soreness constantly, with waxing and waning sharp pains. Associated with nausea and vomiting. Also reports dark brown bloody clots/pink mucous clots per rectum. Denies NSAIDs or aspirin powders currently. No dysphagia. Last endoscopy was in 2007 by Dr. [name], with his note dictating gastritis. I do not have the actual reports at the time of this visit. We will proceed with an upper endoscopy (and colonoscopy as outlined under rectal bleeding) to determine etiology of pain. Will add CBC and HFP for completeness.  Proceed with upper endoscopy in the near future with Dr. [name]. The risks, benefits, and alternatives have been discussed in detail with patient. They have stated understanding and desire to proceed.  CBC, HFP Prilosec daily Further recommendations after procedure

## 2010-08-31 LAB — CBC WITH DIFFERENTIAL/PLATELET
Basophils Absolute: 0 10*3/uL (ref 0.0–0.1)
Basophils Relative: 0 % (ref 0–1)
Eosinophils Relative: 0 % (ref 0–5)
HCT: 37.6 % (ref 36.0–46.0)
Hemoglobin: 12.4 g/dL (ref 12.0–15.0)
MCH: 30.8 pg (ref 26.0–34.0)
MCHC: 33 g/dL (ref 30.0–36.0)
Monocytes Absolute: 0.6 10*3/uL (ref 0.1–1.0)
Neutro Abs: 5.3 10*3/uL (ref 1.7–7.7)
Neutrophils Relative %: 75 % (ref 43–77)
Platelets: 186 10*3/uL (ref 150–400)
RBC: 4.03 MIL/uL (ref 3.87–5.11)
RDW: 12.5 % (ref 11.5–15.5)
WBC: 7 10*3/uL (ref 4.0–10.5)

## 2010-08-31 LAB — HEPATIC FUNCTION PANEL
ALT: 21 U/L (ref 0–35)
AST: 17 U/L (ref 0–37)
Alkaline Phosphatase: 64 U/L (ref 39–117)
Total Bilirubin: 0.7 mg/dL (ref 0.3–1.2)
Total Protein: 6.4 g/dL (ref 6.0–8.3)

## 2010-08-31 NOTE — Progress Notes (Signed)
Cc to PCP 

## 2010-08-31 NOTE — Progress Notes (Signed)
Quick Note:  This looks great. Continue with current plan. ______

## 2010-09-06 MED ORDER — SODIUM CHLORIDE 0.45 % IV SOLN
Freq: Once | INTRAVENOUS | Status: AC
  Administered 2010-09-07: 1000 mL via INTRAVENOUS

## 2010-09-07 ENCOUNTER — Ambulatory Visit (HOSPITAL_COMMUNITY)
Admission: RE | Admit: 2010-09-07 | Discharge: 2010-09-07 | Disposition: A | Discharge status: Home / Self Care | Payer: Federal, State, Local not specified - PPO | Source: Ambulatory Visit | Attending: Internal Medicine | Admitting: Internal Medicine

## 2010-09-07 ENCOUNTER — Encounter (HOSPITAL_COMMUNITY): Payer: Self-pay | Admitting: *Deleted

## 2010-09-07 ENCOUNTER — Encounter (HOSPITAL_COMMUNITY)
Admission: RE | Disposition: A | Discharge status: Home / Self Care | Payer: Self-pay | Source: Ambulatory Visit | Attending: Internal Medicine

## 2010-09-07 ENCOUNTER — Other Ambulatory Visit: Payer: Self-pay | Admitting: Internal Medicine

## 2010-09-07 DIAGNOSIS — K625 Hemorrhage of anus and rectum: Secondary | ICD-10-CM | POA: Insufficient documentation

## 2010-09-07 DIAGNOSIS — K3189 Other diseases of stomach and duodenum: Secondary | ICD-10-CM | POA: Insufficient documentation

## 2010-09-07 DIAGNOSIS — R1013 Epigastric pain: Secondary | ICD-10-CM

## 2010-09-07 DIAGNOSIS — D131 Benign neoplasm of stomach: Secondary | ICD-10-CM

## 2010-09-07 DIAGNOSIS — D126 Benign neoplasm of colon, unspecified: Secondary | ICD-10-CM | POA: Insufficient documentation

## 2010-09-07 HISTORY — PX: COLONOSCOPY: SHX5424

## 2010-09-07 HISTORY — PX: ESOPHAGOGASTRODUODENOSCOPY: SHX5428

## 2010-09-07 SURGERY — COLONOSCOPY
Anesthesia: Moderate Sedation

## 2010-09-07 MED ORDER — MEPERIDINE HCL 100 MG/ML IJ SOLN
INTRAMUSCULAR | Status: AC
  Filled 2010-09-07: qty 2

## 2010-09-07 MED ORDER — MEPERIDINE HCL 100 MG/ML IJ SOLN
INTRAMUSCULAR | Status: DC | PRN
Start: 2010-09-07 — End: 2010-09-07
  Administered 2010-09-07 (×2): 50 mg via INTRAVENOUS

## 2010-09-07 MED ORDER — SIMETHICONE 40 MG/0.6ML PO SUSP
ORAL | Status: DC | PRN
  Administered 2010-09-07: 11:00:00

## 2010-09-07 MED ORDER — MIDAZOLAM HCL 5 MG/5ML IJ SOLN
INTRAMUSCULAR | Status: AC
  Filled 2010-09-07: qty 10

## 2010-09-07 MED ORDER — MIDAZOLAM HCL 5 MG/5ML IJ SOLN
INTRAMUSCULAR | Status: DC | PRN
Start: 2010-09-07 — End: 2010-09-07
  Administered 2010-09-07: 1 mg via INTRAVENOUS
  Administered 2010-09-07 (×2): 2 mg via INTRAVENOUS

## 2010-09-07 MED ORDER — BUTAMBEN-TETRACAINE-BENZOCAINE 2-2-14 % EX AERO
INHALATION_SPRAY | CUTANEOUS | Status: DC | PRN
  Administered 2010-09-07: 1 via TOPICAL

## 2010-09-07 NOTE — H&P (Signed)
Gerrit Halls, NP  08/30/2010  4:08 PM  Signed   Referring Provider: No ref. provider found Primary Care Physician:  Sara Perches, MD Primary Gastroenterologist: Dr. [name]     Chief Complaint   Patient presents with   .  Abdominal Pain      HPI:    [name] is a very pleasant, outgoing 50 year old Caucasian female who was last seen by our practice in October 2009. She has a history of chronic abdominal pain with a very thorough past work-up. She has had 2 colonoscopies as outlined in PMH, as well as an endoscopy by Dr. [name] in 2007. She has had multiple CT scans, SBFT, and celiac antibody panel, all of which were unrevealing. She had been doing well since the visit in October 2009. She reports vigilance regarding a high fiber diet, exercising, and drinking water.    However, she returns today with a several week history of epigastric pain described as a constant soreness, with waxing and waning sharp pains, associated with nausea and vomiting intermittently. Eating does not necessarily exacerbate this. She feels a pressure in the epigastric region. Denies chronic use of NSAIDs or aspirin powders. Denies reflux or dysphagia. She reports passing large clots per rectum, described as dark brown/pink/mucous. She states stool smells pungent, like "poison".   She reports a BM at least every day; however, she has unproductive bowel movements at times, with urgency to go and no bowel movement. She has small, round balls at times.    She has a good appetite, and her weight is fairly stable from the last visit a few years ago (was 191 in Oct 2009).         Past Medical History   Diagnosis  Date   .  Chronic abdominal pain     .  S/P endoscopy  2007       Dr. [name]: gastritis   .  S/P colonoscopy  2002, 2007       2002 Dr. [name]: small external hemorrhoids otherwise normal. 2007 Dr. [name]: normal to cecum   .  HELLP (hemolytic anemia/elev liver enzymes/low platelets in  pregnancy)         Past Surgical History   Procedure  Date   .  Cholecystectomy     .  Cesarean section     .  Exploratory laparoscopy         endometriosis       Current Outpatient Prescriptions   Medication  Sig  Dispense  Refill   .  Multiple Vitamins-Minerals (MULTIVITAMIN WITH MINERALS) tablet  Take 1 tablet by mouth daily.               Allergies as of 08/30/2010   .  (No Known Allergies)       Family History   Problem  Relation  Age of Onset   .  Colon cancer  Neg Hx         History       Social History   .  Marital Status:  Married       Spouse Name:  N/A       Number of Children:  N/A   .  Years of Education:  N/A       Social History Main Topics   .  Smoking status:  Never Smoker    .  Smokeless tobacco:  None   .  Alcohol Use:  No   .  Drug Use:  None   .  Sexually Active:  None       Other Topics  Concern   .  None       Social History Narrative   .  None      Review of Systems: Gen: Denies fever, chills, anorexia. Denies fatigue, weakness, weight loss.   CV: Denies chest pain, palpitations, syncope, peripheral edema, and claudication. Resp: Denies dyspnea at rest, cough, wheezing, coughing up blood, and pleurisy. GI: Denies vomiting blood, jaundice, and fecal incontinence.   Denies dysphagia or odynophagia. Derm: Denies rash, itching, dry skin Psych: Denies depression, anxiety, memory loss, confusion. No homicidal or suicidal ideation.   Heme: Denies bruising, bleeding, and enlarged lymph nodes.   Physical Exam: BP 119/75  Pulse 120  Temp(Src) 99 F (37.2 C) (Temporal)  Ht 5\' 6"  (1.676 m)  Wt 186 lb (84.369 kg)  BMI 30.02 kg/m2 General:   Alert and oriented. No distress noted. Pleasant and cooperative.   Head:  Normocephalic and atraumatic. Eyes:  Conjuctiva clear without scleral icterus. Mouth:  Oral mucosa pink and moist. Good dentition. No lesions. Neck:  Supple, without mass or thyromegaly. Heart:  S1, S2 present without  murmurs, rubs, or gallops. Regular rate and rhythm. Abdomen:  +BS, soft, mildly tender epigastric region, and non-distended. No rebound or guarding. No HSM or masses noted. Msk:  Symmetrical without gross deformities. Normal posture. Extremities:  Without edema. Neurologic:  Alert and  oriented x4;  grossly normal neurologically. Skin:  Intact without significant lesions or rashes. Cervical Nodes:  No significant cervical adenopathy. Psych:  Alert and cooperative. Normal mood and affect.     [name]  08/31/2010  1:02 PM  Signed Cc to PCP  Gerrit Halls, NP  08/31/2010  4:00 PM  Signed Quick Note:   This looks great. Continue with current plan. ______        Abdominal pain - Gerrit Halls, NP  08/30/2010  4:05 PM  Signed 50 year old with a history of chronic abdominal pain in the past, last seen in 2009, with a thorough work-up as outlined in HPI. She presents today with a new discomfort, described as epigastric soreness constantly, with waxing and waning sharp pains. Associated with nausea and vomiting. Also reports dark brown bloody clots/pink mucous clots per rectum. Denies NSAIDs or aspirin powders currently. No dysphagia. Last endoscopy was in 2007 by Dr. [name], with his note dictating gastritis. I do not have the actual reports at the time of this visit. We will proceed with an upper endoscopy (and colonoscopy as outlined under rectal bleeding) to determine etiology of pain. Will add CBC and HFP for completeness.   Proceed with upper endoscopy in the near future with Dr. [name]. The risks, benefits, and alternatives have been discussed in detail with patient. They have stated understanding and desire to proceed.  CBC, HFP Prilosec daily Further recommendations after procedure    Rectal bleeding - Gerrit Halls, NP  08/30/2010  4:08 PM  Signed Several week hx of moderate-sized brown, bloody clots/pink mucous from rectum. She has undergone a colonoscopy in both 2002 and 2007 which were  relatively unrevealing. Will be assessing UGI etiology via EGD; needs colonoscopy due to rectal bleeding, urgency. Will obtain baseline CBC for anemia. If no source of bleeding noted, may need capsule endoscopy.   Proceed with TCS with Dr. [name] in near future: the risks, benefits, and alternatives have been discussed with the patient in detail. The patient states understanding and desires to proceed.  Not recorded            Orders Placed This Encounter     Procedural/ Surgical Case Request: COLONOSCOPY, ESOPHAGOGASTRODUODENOSCOPY (EGD) [SUR1 Custom]    CBC w/Diff [ZOX0960 Custom]    Hepatic function panel [LAB20 Custom]       Results are available for this encounter         Patient Instructions     Start taking Prilosec daily, 30 minutes before breakfast. Contact us if any side effects (headache)   We have set you up for a colonoscopy and endoscopy. Further recommendations will be made after this is completed.   In the interim, you may take a probiotic (samples provided) daily. You may get this over-the-counter as well.   Please have labs completed. We will be contacting you with the results as soon as they are available.     I have seen the patient prior to the procedure(s) today and reviewed the history and physical / consultation from 08/30/10.   Patient states rectal bleeding and epigastric pain have both improved since she was seen in the office on 08/30/2010. She has started Prilosec as instructed. There have been no changes otherwise. After consideration of the risks, benefits, alternatives and imponderables, the patient has consented to the procedure(s).

## 2010-09-09 ENCOUNTER — Encounter: Payer: Self-pay | Admitting: Internal Medicine

## 2010-09-12 ENCOUNTER — Encounter: Payer: Self-pay | Admitting: Gastroenterology

## 2010-09-14 ENCOUNTER — Encounter (HOSPITAL_COMMUNITY): Payer: Self-pay | Admitting: Internal Medicine

## 2010-12-07 ENCOUNTER — Ambulatory Visit: Payer: Federal, State, Local not specified - PPO | Admitting: Urgent Care

## 2010-12-07 ENCOUNTER — Telehealth: Payer: Self-pay | Admitting: Urgent Care

## 2010-12-07 NOTE — Telephone Encounter (Signed)
Please call to reschedule. Thanks

## 2010-12-07 NOTE — Telephone Encounter (Signed)
Pt was a no show

## 2010-12-13 ENCOUNTER — Encounter: Payer: Self-pay | Admitting: Internal Medicine

## 2010-12-13 NOTE — Telephone Encounter (Signed)
Mailed letter for patient to call office to [facility] °

## 2011-06-11 ENCOUNTER — Encounter (HOSPITAL_COMMUNITY): Payer: Self-pay | Admitting: Emergency Medicine

## 2011-06-11 ENCOUNTER — Emergency Department (HOSPITAL_COMMUNITY)
Admission: EM | Admit: 2011-06-11 | Discharge: 2011-06-11 | Disposition: A | Discharge status: Home / Self Care | Payer: Federal, State, Local not specified - PPO | Attending: Emergency Medicine | Admitting: Emergency Medicine

## 2011-06-11 DIAGNOSIS — L509 Urticaria, unspecified: Secondary | ICD-10-CM

## 2011-06-11 MED ORDER — FAMOTIDINE 20 MG PO TABS
40.0000 mg | ORAL_TABLET | Freq: Once | ORAL | Status: AC
  Administered 2011-06-11: 40 mg via ORAL
  Filled 2011-06-11: qty 2

## 2011-06-11 MED ORDER — DIPHENHYDRAMINE HCL 25 MG PO TABS: 25.0000 mg | ORAL_TABLET | Freq: Four times a day (QID) | ORAL | Status: DC

## 2011-06-11 MED ORDER — FAMOTIDINE 20 MG PO TABS: 20.0000 mg | ORAL_TABLET | Freq: Two times a day (BID) | ORAL | Status: DC

## 2011-06-11 MED ORDER — PREDNISONE 20 MG PO TABS
60.0000 mg | ORAL_TABLET | Freq: Once | ORAL | Status: AC
  Administered 2011-06-11: 60 mg via ORAL
  Filled 2011-06-11: qty 3

## 2011-06-11 MED ORDER — PREDNISONE 10 MG PO TABS: 40.0000 mg | ORAL_TABLET | Freq: Every day | ORAL | Status: DC

## 2011-06-11 NOTE — ED Provider Notes (Signed)
History     CSN: [id number]  Arrival date & time 06/11/11  0211   First MD Initiated Contact with Patient 06/11/11 [phone]      Chief Complaint  Patient presents with  . Allergic Reaction    (Consider location/radiation/quality/duration/timing/severity/associated sxs/prior treatment) Patient is a 51 y.o. female presenting with allergic reaction. The history is provided by the patient.  Allergic Reaction The primary symptoms are  rash and urticaria. The primary symptoms do not include wheezing, shortness of breath, cough, abdominal pain, nausea, vomiting, diarrhea, dizziness, palpitations, altered mental status or angioedema. The current episode started 3 to 5 hours ago. The problem has been gradually improving.  Significant symptoms that are not present include eye redness.  the rash is high like very itchy patient's not sure the etiology no new foods or exposures or new medications. No history of similar reaction. Patient took 50 mg of Benadryl at home with some improvement.  Past Medical History  Diagnosis Date  . Chronic abdominal pain   . S/P endoscopy 2007    Dr. [name]: gastritis (reviewed records)  . S/P colonoscopy 2002, 2007    2002 Dr. [name]: small external hemorrhoids otherwise normal. 2007 Dr. [name]: normal to cecum  . HELLP (hemolytic anemia/elev liver enzymes/low platelets in pregnancy)     Past Surgical History  Procedure Date  . Cholecystectomy   . Cesarean section   . Exploratory laparoscopy     endometriosis  . Tonsillectomy   . Colonoscopy 09/07/2010    Procedure: COLONOSCOPY;  Surgeon: Corbin Ade, MD;  [facility];  Service: Endoscopy;  Laterality: N/A;  10:45  . Esophagogastroduodenoscopy 09/07/2010    Procedure: ESOPHAGOGASTRODUODENOSCOPY (EGD);  Surgeon: Corbin Ade, MD;  Location: [facility];  Service: Endoscopy;  Laterality: N/A;    Family History  Problem Relation Age of Onset  . Colon cancer Neg Hx   . Diabetes Mother   .  Coronary artery disease Mother   . Cancer Father     History  Substance Use Topics  . Smoking status: Never Smoker   . Smokeless tobacco: Not on file  . Alcohol Use: No    OB History    Grav Para Term Preterm Abortions TAB SAB Ect Mult Living                  Review of Systems  Constitutional: Negative for fever.  HENT: Negative for congestion and neck pain.   Eyes: Negative for redness and itching.  Respiratory: Negative for cough, shortness of breath and wheezing.   Cardiovascular: Negative for palpitations.  Gastrointestinal: Negative for nausea, vomiting, abdominal pain and diarrhea.  Genitourinary: Negative for dysuria.  Musculoskeletal: Negative for back pain.  Skin: Positive for rash.  Neurological: Negative for dizziness and headaches.  Psychiatric/Behavioral: Negative for altered mental status.    Allergies  Review of patient's allergies indicates no known allergies.  Home Medications   Current Outpatient Rx  Name Route Sig Dispense Refill  . MULTI-VITAMIN/MINERALS PO TABS Oral Take 1 tablet by mouth daily.        BP 112/62  Pulse 90  Temp(Src) 97.7 F (36.5 C) (Oral)  Resp 16  Ht 5\' 5"  (1.651 m)  Wt 185 lb (83.915 kg)  BMI 30.79 kg/m2  SpO2 98%  LMP 06/04/2011  Physical Exam  Nursing note and vitals reviewed. Constitutional: She is oriented to person, place, and time. She appears well-developed and well-nourished. No distress.  HENT:  Head: Normocephalic and atraumatic.  Mouth/Throat: Oropharynx is clear and moist.       No swelling of the lips or tongue.  Eyes: Conjunctivae are normal. Pupils are equal, round, and reactive to light.  Neck: Normal range of motion. Neck supple.  Cardiovascular: Normal rate, regular rhythm and normal heart sounds.   No murmur heard. Pulmonary/Chest: Effort normal and breath sounds normal. No respiratory distress. She has no wheezes.  Abdominal: Soft. Bowel sounds are normal. There is no tenderness.    Musculoskeletal: Normal range of motion. She exhibits no edema.  Lymphadenopathy:    She has no cervical adenopathy.  Neurological: She is alert and oriented to person, place, and time. No cranial nerve deficit. She exhibits normal muscle tone. Coordination normal.  Skin: Skin is warm. Rash noted.       Urticarial type rash predominantly to the abdomen and lower part of the back some on her arms.    ED Course  Procedures (including critical care time)  Labs Reviewed - No data to display No results found.   1. Hives       MDM  Patient with hives to her abdomen and lower back and upper extremities. Onset.spontaneously at around midnight patient took Benadryl 50 mg total with some improvement but not complete relief. No history of any specific allergies or new exposure or new food exposure to night. Treated in the emergency department with Pepcid and prednisone we'll have patient continue the Benadryl for the next 3 days Pepcid for 7 days then prednisone for the next 5 days. No evidence of angioedema no wheezing no evidence of a serious allergic reaction. Patient not sure of the cause no new exposures or new food ingestion.        Shelda Jakes, MD 06/11/11 [phone]

## 2011-06-11 NOTE — ED Notes (Signed)
Patient stated she woke up with itching and rash on abdomen, hips, arms, and ears.

## 2011-06-11 NOTE — Discharge Instructions (Signed)
Allergic Reaction Allergic reactions can be caused by anything your body is sensitive to. Your body may be sensitive to food, medicines, molds, pollens, cockroaches, dust mites, pets, insect stings, and other things around you. An allergic reaction may cause puffiness (swelling), itching, sneezing, coughing, or problems breathing.  Allergies cannot be cured, but they can be controlled with medicine. Some allergies happen only at certain times of the year. Try to stay away from what causes your reaction if possible. Sometimes, it is hard to tell what causes your reaction. HOME CARE If you have a rash or red patches (hives) on your skin:  Take medicines as told by your doctor.   Do not drive or drink alcohol after taking medicines. They can make you sleepy.   Put cold cloths on your skin. Take baths in cool water. This will help your itching. Do not take hot baths or showers. Heat will make the itching worse.   If your allergies get worse, your doctor might give you other medicines. Talk to your doctor if problems continue.  GET HELP RIGHT AWAY IF:   You have trouble breathing.   You have a tight feeling in your chest or throat.   Your mouth gets puffy (swollen).   You have red, itchy patches on your skin (hives) that get worse.   You have itching all over your body.  MAKE SURE YOU:   Understand these instructions.   Will watch your condition.   Will get help right away if you are not doing well or get worse.  Document Released: 12/07/2008 Document Revised: 12/08/2010 Document Reviewed: 12/07/2008 [facility] Patient Information 2012 [place], [place].  Take Benadryl 25 mg every 6 hours for the next 3 days. Take Pepcid for the next 7 days. Take prednisone for the next 5 days. Return for any new or worse symptoms. Return to the emergency department for tongue swelling lip swelling or trouble breathing. If hives persist followup with your primary care Dr. In next few days.

## 2011-06-11 NOTE — ED Notes (Signed)
Pt reporting that she woke up about 1130 with severe itching and noticed red welts across abdomen, thighs and arms.  States that she took 2 benadryl at that time and has had no relief.  Pt denies any SOB or other difficulties.

## 2013-03-04 ENCOUNTER — Emergency Department: Payer: Self-pay | Admitting: Emergency Medicine

## 2013-03-04 LAB — BASIC METABOLIC PANEL
ANION GAP: 5 — AB (ref 7–16)
BUN: 12 mg/dL (ref 7–18)
CO2: 29 mmol/L (ref 21–32)
CREATININE: 0.7 mg/dL (ref 0.60–1.30)
Calcium, Total: 8.7 mg/dL (ref 8.5–10.1)
Chloride: 107 mmol/L (ref 98–107)
GLUCOSE: 73 mg/dL (ref 65–99)
OSMOLALITY: 280 (ref 275–301)
Potassium: 3.5 mmol/L (ref 3.5–5.1)
SODIUM: 141 mmol/L (ref 136–145)

## 2013-03-04 LAB — CBC
HCT: 39.6 % (ref 35.0–47.0)
HGB: 14 g/dL (ref 12.0–16.0)
MCH: 32.4 pg (ref 26.0–34.0)
MCHC: 35.4 g/dL (ref 32.0–36.0)
MCV: 92 fL (ref 80–100)
Platelet: 203 10*3/uL (ref 150–440)
RBC: 4.32 10*6/uL (ref 3.80–5.20)
RDW: 13 % (ref 11.5–14.5)
WBC: 6 10*3/uL (ref 3.6–11.0)

## 2013-03-04 LAB — TROPONIN I: Troponin-I: <0.02 ng/mL

## 2013-03-05 ENCOUNTER — Other Ambulatory Visit (HOSPITAL_COMMUNITY): Payer: Self-pay | Admitting: Internal Medicine

## 2013-03-05 DIAGNOSIS — H539 Unspecified visual disturbance: Secondary | ICD-10-CM

## 2013-03-06 ENCOUNTER — Ambulatory Visit (HOSPITAL_COMMUNITY)
Admission: RE | Admit: 2013-03-06 | Discharge: 2013-03-06 | Disposition: A | Discharge status: Home / Self Care | Payer: Federal, State, Local not specified - PPO | Source: Ambulatory Visit | Attending: Internal Medicine | Admitting: Internal Medicine

## 2013-03-06 ENCOUNTER — Encounter (HOSPITAL_COMMUNITY): Payer: Self-pay

## 2013-03-06 DIAGNOSIS — I6789 Other cerebrovascular disease: Secondary | ICD-10-CM | POA: Insufficient documentation

## 2013-03-06 DIAGNOSIS — D320 Benign neoplasm of cerebral meninges: Secondary | ICD-10-CM | POA: Insufficient documentation

## 2013-03-06 DIAGNOSIS — H539 Unspecified visual disturbance: Secondary | ICD-10-CM

## 2013-03-06 MED ORDER — GADOBENATE DIMEGLUMINE 529 MG/ML IV SOLN
16.0000 mL | Freq: Once | INTRAVENOUS | Status: AC | PRN
  Administered 2013-03-06: 16 mL via INTRAVENOUS

## 2013-12-12 ENCOUNTER — Other Ambulatory Visit (HOSPITAL_COMMUNITY): Payer: Self-pay | Admitting: Internal Medicine

## 2013-12-12 DIAGNOSIS — D329 Benign neoplasm of meninges, unspecified: Secondary | ICD-10-CM

## 2013-12-17 ENCOUNTER — Ambulatory Visit (HOSPITAL_COMMUNITY)
Admission: RE | Admit: 2013-12-17 | Discharge: 2013-12-17 | Disposition: A | Discharge status: Home / Self Care | Payer: Federal, State, Local not specified - PPO | Source: Ambulatory Visit | Attending: Internal Medicine | Admitting: Internal Medicine

## 2013-12-17 DIAGNOSIS — D329 Benign neoplasm of meninges, unspecified: Secondary | ICD-10-CM | POA: Insufficient documentation

## 2013-12-17 DIAGNOSIS — R9082 White matter disease, unspecified: Secondary | ICD-10-CM | POA: Diagnosis not present

## 2013-12-17 MED ORDER — GADOBENATE DIMEGLUMINE 529 MG/ML IV SOLN
15.0000 mL | Freq: Once | INTRAVENOUS | Status: AC | PRN
  Administered 2013-12-17: 15 mL via INTRAVENOUS

## 2015-04-04 ENCOUNTER — Emergency Department (HOSPITAL_COMMUNITY)
Admission: EM | Admit: 2015-04-04 | Discharge: 2015-04-04 | Disposition: A | Discharge status: Home / Self Care | Payer: Federal, State, Local not specified - PPO | Attending: Emergency Medicine | Admitting: Emergency Medicine

## 2015-04-04 ENCOUNTER — Encounter (HOSPITAL_COMMUNITY): Payer: Self-pay | Admitting: *Deleted

## 2015-04-04 ENCOUNTER — Emergency Department (HOSPITAL_COMMUNITY): Payer: Federal, State, Local not specified - PPO

## 2015-04-04 DIAGNOSIS — Y9289 Other specified places as the place of occurrence of the external cause: Secondary | ICD-10-CM | POA: Insufficient documentation

## 2015-04-04 DIAGNOSIS — R110 Nausea: Secondary | ICD-10-CM | POA: Insufficient documentation

## 2015-04-04 DIAGNOSIS — W1839XA Other fall on same level, initial encounter: Secondary | ICD-10-CM | POA: Diagnosis not present

## 2015-04-04 DIAGNOSIS — Z79899 Other long term (current) drug therapy: Secondary | ICD-10-CM | POA: Insufficient documentation

## 2015-04-04 DIAGNOSIS — S060X1A Concussion with loss of consciousness of 30 minutes or less, initial encounter: Secondary | ICD-10-CM | POA: Insufficient documentation

## 2015-04-04 DIAGNOSIS — Y999 Unspecified external cause status: Secondary | ICD-10-CM | POA: Diagnosis not present

## 2015-04-04 DIAGNOSIS — Y9389 Activity, other specified: Secondary | ICD-10-CM | POA: Diagnosis not present

## 2015-04-04 DIAGNOSIS — S0990XA Unspecified injury of head, initial encounter: Secondary | ICD-10-CM | POA: Diagnosis present

## 2015-04-04 HISTORY — DX: Neoplasm of unspecified behavior of endocrine glands and other parts of nervous system: D49.7

## 2015-04-04 MED ORDER — IBUPROFEN 800 MG PO TABS: 800.0000 mg | ORAL_TABLET | Freq: Three times a day (TID) | ORAL | Status: DC

## 2015-04-04 MED ORDER — ONDANSETRON 4 MG PO TBDP
4.0000 mg | ORAL_TABLET | Freq: Once | ORAL | Status: AC
  Administered 2015-04-04: 4 mg via ORAL
  Filled 2015-04-04: qty 1

## 2015-04-04 MED ORDER — ONDANSETRON 4 MG PO TBDP: 4.0000 mg | ORAL_TABLET | Freq: Three times a day (TID) | ORAL | Status: DC | PRN

## 2015-04-04 NOTE — ED Notes (Signed)
Pt states that she was outside playing with her daughter when she lost her balance, thinks that she may have tripped, causing her to fall hitting her head on the concrete pad, admits to LOC, states that she does not feel right now and is nauseous,

## 2015-04-04 NOTE — ED Provider Notes (Signed)
CSN: [id number]     Arrival date & time 04/04/15  2044 History  By signing my name below, I, Altamease Oiler, attest that this documentation has been prepared under the direction and in the presence of Noemi Chapel, MD. Electronically Signed: Altamease Oiler, ED Scribe. 04/04/2015. 9:25 PM   Chief Complaint  Patient presents with  . Fall    No language interpreter was used.   [name] is a 55 y.o. female who presents to the Emergency Department complaining of a fall at home 2 hours ago. Pt states that she was playing with her daughter outside and lost balance. She tried to stop her fall with her hands but struck the right side of her head on the concrete before losing consciousness. She was able to get off the ground and ambulate after sitting for a moment.  Associated symptoms include right sided facial pain, right sided tinnitus, and nausea. She is concerned because she struck her head close to the location of a known benign tumor. She has known about the tumor for 2 years after being worked up for a stroke because she lost vision in the right eye. Pt denies blurred vision, numbness, weakness, trouble speaking or breathing, chest pain, and vomiting.    Past Medical History  Diagnosis Date  . Chronic abdominal pain   . S/P endoscopy 2007    Dr. Irving Shows: gastritis (reviewed records)  . S/P colonoscopy 2002, 2007    2002 Dr. Laural Golden: small external hemorrhoids otherwise normal. 2007 Dr. Tamala Julian: normal to cecum  . HELLP (hemolytic anemia/elev liver enzymes/low platelets in pregnancy)   . Meningeal tumor ([place])     benign   Past Surgical History  Procedure Laterality Date  . Cholecystectomy    . Cesarean section    . Exploratory laparoscopy      endometriosis  . Tonsillectomy    . Colonoscopy  09/07/2010    Procedure: COLONOSCOPY;  Surgeon: Daneil Dolin, MD;  [facility];  Service: Endoscopy;  Laterality: N/A;  10:45  . Esophagogastroduodenoscopy  09/07/2010     Procedure: ESOPHAGOGASTRODUODENOSCOPY (EGD);  Surgeon: Daneil Dolin, MD;  [facility];  Service: Endoscopy;  Laterality: N/A;   Family History  Problem Relation Age of Onset  . Colon cancer Neg Hx   . Diabetes Mother   . Coronary artery disease Mother   . Cancer Father    Social History  Substance Use Topics  . Smoking status: Never Smoker   . Smokeless tobacco: None  . Alcohol Use: No   OB History    No data available     Review of Systems  HENT: Positive for tinnitus.        Facial pain   Eyes: Negative for visual disturbance.  Respiratory: Negative for shortness of breath.   Cardiovascular: Negative for chest pain.  Gastrointestinal: Positive for nausea. Negative for vomiting.  Neurological: Negative for speech difficulty, weakness and numbness.       LOC  All other systems reviewed and are negative.   Allergies  Review of patient's allergies indicates no known allergies.  Home Medications   Prior to Admission medications   Medication Sig Start Date End Date Taking? Authorizing Provider  Multiple Vitamin (MULTIVITAMIN WITH MINERALS) TABS tablet Take 1 tablet by mouth daily.   Yes Historical Provider, MD  ibuprofen (ADVIL,MOTRIN) 800 MG tablet Take 1 tablet (800 mg total) by mouth 3 (three) times daily. 04/04/15   Noemi Chapel, MD  ondansetron (  ZOFRAN ODT) 4 MG disintegrating tablet Take 1 tablet (4 mg total) by mouth every 8 (eight) hours as needed for nausea. 04/04/15   Noemi Chapel, MD   BP 141/80 mmHg  Pulse 99  Temp(Src) 98.6 F (37 C) (Oral)  Resp 16  Ht 5\' 6"  (1.676 m)  Wt 190 lb (86.183 kg)  BMI 30.68 kg/m2  SpO2 99%  LMP 09/17/2012 Physical Exam  Constitutional: She appears well-developed and well-nourished. No distress.  HENT:  Head: Normocephalic.  Mouth/Throat: Oropharynx is clear and moist. No oropharyngeal exudate.  3 cm hematoma to the right temporal region no facial tenderness, deformity, malocclusion or hemotympanum.  no battle's  sign or racoon eyes.   Eyes: Conjunctivae and EOM are normal. Pupils are equal, round, and reactive to light. Right eye exhibits no discharge. Left eye exhibits no discharge. No scleral icterus.  Neck: Normal range of motion. Neck supple. No JVD present. No thyromegaly present.  Cardiovascular: Normal rate, regular rhythm, normal heart sounds and intact distal pulses.  Exam reveals no gallop and no friction rub.   No murmur heard. Pulmonary/Chest: Effort normal and breath sounds normal. No respiratory distress. She has no wheezes. She has no rales.  Abdominal: Soft. Bowel sounds are normal. She exhibits no distension and no mass. There is no tenderness.  Musculoskeletal: Normal range of motion. She exhibits no edema or tenderness.  Lymphadenopathy:    She has no cervical adenopathy.  Neurological: She is alert. Coordination normal.  Neurologic exam:  Speech clear, pupils equal round reactive to light, extraocular movements intact  Normal peripheral visual fields Cranial nerves III through XII normal including no facial droop Follows commands, moves all extremities x4, normal strength to bilateral upper and lower extremities at all major muscle groups including grip Sensation normal to light touch and pinprick Coordination intact, no limb ataxia, finger-nose-finger normal Rapid alternating movements normal No pronator drift Gait normal  Normal   Skin: Skin is warm and dry. No rash noted. No erythema.  Psychiatric: She has a normal mood and affect. Her behavior is normal.  Nursing note and vitals reviewed.   ED Course  Procedures (including critical care time) DIAGNOSTIC STUDIES: Oxygen Saturation is 99% on RA,  normal by my interpretation.    COORDINATION OF CARE: 9:17 PM Discussed treatment plan which includes CT head without contrast and Zofran with pt at bedside and pt agreed to plan.  Labs Review Labs Reviewed - No data to display  Imaging Review Ct Head Wo  Contrast  04/04/2015  CLINICAL DATA:  Fall while planning with daughter hitting head on concrete with loss of consciousness. History of benign frontal tumor EXAM: CT HEAD WITHOUT CONTRAST TECHNIQUE: Contiguous axial images were obtained from the base of the skull through the vertex without intravenous contrast. COMPARISON:  MRI 12/17/2013 and CT 03/04/2013 FINDINGS: Ventricles and cisterns are within normal. No change in patient's known 1.6 cm right frontal meningioma. No evidence of intra-axial mass, mass effect, shift of midline structures or acute hemorrhage. No evidence of acute infarction. Remaining bones and soft tissues are within normal. IMPRESSION: No acute intracranial findings. Stable known 1.6 cm right frontal meningioma. Electronically Signed   By: Marin Olp M.D.   On: 04/04/2015 21:49   I have personally reviewed and evaluated these images as part of my medical decision-making.    MDM   Final diagnoses:  Concussion, with loss of consciousness of 30 minutes or less, initial encounter    The pt is well appearing but  has some mild sx that are persistent - hematoma of temporal scalp  Imaging needed CT neg for bleed / frx Stable meningioma.    I personally performed the services described in this documentation, which was scribed in my presence. The recorded information has been reviewed and is accurate.       Noemi Chapel, MD 04/04/15 2227

## 2015-04-04 NOTE — Discharge Instructions (Signed)

## 2015-08-05 ENCOUNTER — Encounter: Payer: Self-pay | Admitting: Internal Medicine

## 2017-02-03 DIAGNOSIS — B349 Viral infection, unspecified: Secondary | ICD-10-CM | POA: Diagnosis not present

## 2017-02-12 DIAGNOSIS — Z0000 Encounter for general adult medical examination without abnormal findings: Secondary | ICD-10-CM | POA: Diagnosis not present

## 2017-02-12 DIAGNOSIS — R5381 Other malaise: Secondary | ICD-10-CM | POA: Diagnosis not present

## 2017-02-14 DIAGNOSIS — Z0001 Encounter for general adult medical examination with abnormal findings: Secondary | ICD-10-CM | POA: Diagnosis not present

## 2017-02-15 ENCOUNTER — Other Ambulatory Visit (HOSPITAL_COMMUNITY): Payer: Self-pay | Admitting: Adult Health Nurse Practitioner

## 2017-02-15 ENCOUNTER — Other Ambulatory Visit (HOSPITAL_COMMUNITY): Payer: Self-pay | Admitting: Internal Medicine

## 2017-02-15 DIAGNOSIS — E010 Iodine-deficiency related diffuse (endemic) goiter: Secondary | ICD-10-CM

## 2017-03-15 ENCOUNTER — Ambulatory Visit (HOSPITAL_COMMUNITY): Admission: RE | Admit: 2017-03-15 | Payer: Federal, State, Local not specified - PPO | Source: Ambulatory Visit

## 2017-10-04 DIAGNOSIS — H00015 Hordeolum externum left lower eyelid: Secondary | ICD-10-CM | POA: Diagnosis not present

## 2017-10-04 DIAGNOSIS — Z6833 Body mass index (BMI) 33.0-33.9, adult: Secondary | ICD-10-CM | POA: Diagnosis not present

## 2018-03-02 DIAGNOSIS — R05 Cough: Secondary | ICD-10-CM | POA: Diagnosis not present

## 2018-03-02 DIAGNOSIS — J069 Acute upper respiratory infection, unspecified: Secondary | ICD-10-CM | POA: Diagnosis not present

## 2018-03-02 DIAGNOSIS — R0982 Postnasal drip: Secondary | ICD-10-CM | POA: Diagnosis not present

## 2018-10-21 ENCOUNTER — Other Ambulatory Visit: Payer: Self-pay

## 2018-10-21 ENCOUNTER — Ambulatory Visit
Admission: EM | Admit: 2018-10-21 | Discharge: 2018-10-21 | Disposition: A | Discharge status: Home / Self Care | Payer: Federal, State, Local not specified - PPO | Attending: Emergency Medicine | Admitting: Emergency Medicine

## 2018-10-21 DIAGNOSIS — R0789 Other chest pain: Secondary | ICD-10-CM | POA: Diagnosis not present

## 2018-10-21 DIAGNOSIS — Z8249 Family history of ischemic heart disease and other diseases of the circulatory system: Secondary | ICD-10-CM

## 2018-10-21 DIAGNOSIS — R002 Palpitations: Secondary | ICD-10-CM

## 2018-10-21 NOTE — ED Triage Notes (Signed)
Pt presents to UC w/ c/o chest pains since this morning. Pt states she took a nap today and the chest pain woke her. She states it feels like it could be stress related.

## 2018-10-21 NOTE — ED Provider Notes (Signed)
[place]   [id number] 10/21/18 Arrival Time: [id number]   CC: CHEST pressure/ fluttering  SUBJECTIVE:  [name] is a 58 y.o. female who presents with complaint of gradual heart fluttering and chest pressure that began this morning.  Denies a precipitating event, trauma, recent lower respiratory tract, or strenuous upper body activities.  Does admit to recent stressors at work.  Localizes chest pain to the substernal region.  Describes as stable, that is constant and pressure in character.  Rates pain as 5-6/10.   Has NOT tried OTC medication without relief.  Tried sleeping, but did not alleviate her symptoms.  Denies aggravating factors.  Denies radiating symptoms.  Denies previous symptoms in the past.  Complains of associated palpitation, tachycardia, nausea, and anxiety. Denies fever, chills, lightheadedness, dizziness, SOB, vomiting, abdominal pain, changes in bowel or bladder habits, diaphoresis, numbness/tingling in extremities, peripheral edema.  Denies SOB, calf pain or swelling, recent long travel, recent surgery, malignancy, hormone use, or previous blood clot.  Admits to chewing tobacco  Parents passed away from "heart trouble," CHF.  Mother had a major heart attack in her 50's  Previous cardiac testing: electrocardiogram (ECG).  ROS: As per HPI.  All other pertinent ROS negative.    Past Medical History:  Diagnosis Date  . Chronic abdominal pain   . HELLP (hemolytic anemia/elev liver enzymes/low platelets in pregnancy)   . Meningeal tumor    benign  . S/P colonoscopy 2002, 2007   2002 Dr. Laural Golden: small external hemorrhoids otherwise normal. 2007 Dr. Tamala Julian: normal to cecum  . S/P endoscopy 2007   Dr. Irving Shows: gastritis (reviewed records)   Past Surgical History:  Procedure Laterality Date  . CESAREAN SECTION    . CHOLECYSTECTOMY    . COLONOSCOPY  09/07/2010   Procedure: COLONOSCOPY;  Surgeon: Daneil Dolin, MD;  Location: [facility];  Service:  Endoscopy;  Laterality: N/A;  10:45  . ESOPHAGOGASTRODUODENOSCOPY  09/07/2010   Procedure: ESOPHAGOGASTRODUODENOSCOPY (EGD);  Surgeon: Daneil Dolin, MD;  Location: [facility];  Service: Endoscopy;  Laterality: N/A;  . exploratory laparoscopy     endometriosis  . TONSILLECTOMY     No Known Allergies No current facility-administered medications on file prior to encounter.    Current Outpatient Medications on File Prior to Encounter  Medication Sig Dispense Refill  . Multiple Vitamin (MULTIVITAMIN WITH MINERALS) TABS tablet Take 1 tablet by mouth daily.     Social History   Socioeconomic History  . Marital status: Married    Spouse name: Not on file  . Number of children: Not on file  . Years of education: Not on file  . Highest education level: Not on file  Occupational History  . Not on file  Social Needs  . Financial resource strain: Not on file  . Food insecurity    Worry: Not on file    Inability: Not on file  . Transportation needs    Medical: Not on file    Non-medical: Not on file  Tobacco Use  . Smoking status: Never Smoker  . Smokeless tobacco: Never Used  Substance and Sexual Activity  . Alcohol use: No  . Drug use: No  . Sexual activity: Yes  Lifestyle  . Physical activity    Days per week: Not on file    Minutes per session: Not on file  . Stress: Not on file  Relationships  . Social connections    Talks on phone: Not on file  Gets together: Not on file    Attends religious service: Not on file    Active member of club or organization: Not on file    Attends meetings of clubs or organizations: Not on file    Relationship status: Not on file  . Intimate partner violence    Fear of current or ex partner: Not on file    Emotionally abused: Not on file    Physically abused: Not on file    Forced sexual activity: Not on file  Other Topics Concern  . Not on file  Social History Narrative  . Not on file   Family History  Problem Relation Age of  Onset  . Diabetes Mother   . Coronary artery disease Mother   . Cancer Father   . Colon cancer Neg Hx      OBJECTIVE:  Vitals:   10/21/18 1701  BP: 120/78  Pulse: (!) 112  Resp: 16  Temp: 98.3 F (36.8 C)  TempSrc: Oral  SpO2: 96%    General appearance: alert; no distress Eyes: PERRLA; EOMI; conjunctiva normal HENT: normocephalic; atraumatic Neck: supple; no carotid bruits Lungs: clear to auscultation bilaterally without adventitious breath sounds Heart: regular rate and rhythm.  Clear S1 and S2 without rubs, gallops, or murmur. Chest Wall: TTP over sternum; NTTP with AP and lateral compression of chest Abdomen: soft, non-tender; bowel sounds normal;  no guarding Extremities: no cyanosis or edema; symmetrical with no gross deformities Skin: warm and dry Psychological: alert and cooperative; normal mood and affect  ECG: Orders placed or performed during the hospital encounter of 10/21/18  . ED EKG  . ED EKG    EKG normal sinus rhythm without ST elevations, depressions, or prolonged PR interval.  No narrowing or widening of the QRS complexes.  Comparable to past EKG on file.    ASSESSMENT & PLAN:  1. Chest pressure   2. Palpitations   3. Family history of heart attack     Recommending further evaluation and management in the ED.  Cannot rule out cardiac cause. Symptoms and history concerning.  Patient aware and in agreement with this plan.  Will go by private vehicle to [facility] ED.      Lestine Box, PA-C 10/22/18 1020

## 2018-10-21 NOTE — Discharge Instructions (Addendum)
Recommending further evaluation and management in the ED.  Cannot rule out cardiac cause. Symptoms and history concerning.  Patient aware and in agreement with this plan.  Will go by private vehicle to [facility] ED.

## 2018-10-22 ENCOUNTER — Encounter (HOSPITAL_COMMUNITY): Payer: Self-pay | Admitting: *Deleted

## 2018-10-22 ENCOUNTER — Emergency Department (HOSPITAL_COMMUNITY)
Admission: EM | Admit: 2018-10-22 | Discharge: 2018-10-22 | Discharge status: Against Medical Advice | Payer: Federal, State, Local not specified - PPO | Attending: Emergency Medicine | Admitting: Emergency Medicine

## 2018-10-22 ENCOUNTER — Other Ambulatory Visit: Payer: Self-pay

## 2018-10-22 DIAGNOSIS — I209 Angina pectoris, unspecified: Secondary | ICD-10-CM | POA: Diagnosis not present

## 2018-10-22 DIAGNOSIS — R072 Precordial pain: Secondary | ICD-10-CM

## 2018-10-22 DIAGNOSIS — R0789 Other chest pain: Secondary | ICD-10-CM | POA: Diagnosis not present

## 2018-10-22 DIAGNOSIS — R079 Chest pain, unspecified: Secondary | ICD-10-CM

## 2018-10-22 LAB — BASIC METABOLIC PANEL
Anion gap: 9 (ref 5–15)
BUN: 16 mg/dL (ref 6–20)
CO2: 27 mmol/L (ref 22–32)
Calcium: 9.5 mg/dL (ref 8.9–10.3)
Chloride: 105 mmol/L (ref 98–111)
Creatinine, Ser: 0.79 mg/dL (ref 0.44–1.00)
GFR calc Af Amer: >60 mL/min (ref >60)
GFR calc non Af Amer: >60 mL/min (ref >60)
Glucose, Bld: 98 mg/dL (ref 70–99)
Potassium: 3.9 mmol/L (ref 3.5–5.1)
Sodium: 141 mmol/L (ref 135–145)

## 2018-10-22 LAB — TROPONIN I (HIGH SENSITIVITY): Troponin I (High Sensitivity): <2 ng/L (ref <18)

## 2018-10-22 LAB — CBC
HCT: 41 % (ref 36.0–46.0)
Hemoglobin: 13.3 g/dL (ref 12.0–15.0)
MCH: 30.4 pg (ref 26.0–34.0)
MCHC: 32.4 g/dL (ref 30.0–36.0)
MCV: 93.6 fL (ref 80.0–100.0)
Platelets: 268 10*3/uL (ref 150–400)
RBC: 4.38 MIL/uL (ref 3.87–5.11)
RDW: 12 % (ref 11.5–15.5)
WBC: 7.3 10*3/uL (ref 4.0–10.5)
nRBC: 0 % (ref 0.0–0.2)

## 2018-10-22 NOTE — Discharge Instructions (Addendum)
We saw you in the ER for the chest pain/shortness of breath. All of our cardiac workup is normal, including labs, EKG and chest X-RAY are normal. We are not sure what is causing your discomfort, but we feel comfortable sending you home at this time. The workup in the ER is not complete, and you should follow up with your primary care doctor for further evaluation.  Please return to the ER if you have worsening chest pain, shortness of breath, pain radiating to your jaw, shoulder, or back, sweats or fainting. Otherwise see the Cardiologist or your primary care doctor as requested.  Please start taking baby aspirin daily.

## 2018-10-22 NOTE — ED Provider Notes (Signed)
[facility] EMERGENCY DEPARTMENT Provider Note   CSN: [id number] Arrival date & time: 10/22/18  1722     History   Chief Complaint Chief Complaint  Patient presents with  . Chest Pain    HPI [name] is a 58 y.o. female.  HPI: A 58 year old patient presents for evaluation of chest pain. Initial onset of pain was less than one hour ago. The patient's chest pain is described as heaviness/pressure/tightness and is not worse with exertion. The patient complains of nausea and reports some diaphoresis. The patient's chest pain is middle- or left-sided, is not well-localized, is not sharp and does not radiate to the arms/jaw/neck. The patient has a family history of coronary artery disease in a first-degree relative with onset less than age 43. The patient has no history of stroke, has no history of peripheral artery disease, has not smoked in the past 90 days, denies any history of treated diabetes, is not hypertensive, has no history of hypercholesterolemia and does not have an elevated BMI (>=30).   Patient reports that her mother had MI while she was in her 64s. The first episode of chest pain was yesterday.  She had 3 separate episodes of chest pain yesterday and she went to urgent care.  Today she has been having chest soreness.  She thinks that her symptoms are worse with exertion.  HPI  Past Medical History:  Diagnosis Date  . Chronic abdominal pain   . HELLP (hemolytic anemia/elev liver enzymes/low platelets in pregnancy)   . Meningeal tumor    benign  . S/P colonoscopy 2002, 2007   2002 Dr. Laural Golden: small external hemorrhoids otherwise normal. 2007 Dr. Tamala Julian: normal to cecum  . S/P endoscopy 2007   Dr. Irving Shows: gastritis (reviewed records)    Patient Active Problem List   Diagnosis Date Noted  . Abdominal pain 08/30/2010  . Rectal bleeding 08/30/2010    Past Surgical History:  Procedure Laterality Date  . CESAREAN SECTION    . CHOLECYSTECTOMY    .  COLONOSCOPY  09/07/2010   Procedure: COLONOSCOPY;  Surgeon: Daneil Dolin, MD;  Location[facility];  Service: Endoscopy;  Laterality: N/A;  10:45  . ESOPHAGOGASTRODUODENOSCOPY  09/07/2010   Procedure: ESOPHAGOGASTRODUODENOSCOPY (EGD);  Surgeon: Daneil Dolin, MD;  Location: [facility];  Service: Endoscopy;  Laterality: N/A;  . exploratory laparoscopy     endometriosis  . TONSILLECTOMY       OB History   No obstetric history on file.      Home Medications    Prior to Admission medications   Medication Sig Start Date End Date Taking? Authorizing Provider  Multiple Vitamin (MULTIVITAMIN WITH MINERALS) TABS tablet Take 1 tablet by mouth daily.    [provider]    Family History Family History  Problem Relation Age of Onset  . Diabetes Mother   . Coronary artery disease Mother   . Cancer Father   . Colon cancer Neg Hx     Social History Social History   Tobacco Use  . Smoking status: Never Smoker  . Smokeless tobacco: Never Used  Substance Use Topics  . Alcohol use: No  . Drug use: No     Allergies   Patient has no known allergies.   Review of Systems Review of Systems  Constitutional: Positive for activity change and diaphoresis.  Respiratory: Negative for shortness of breath.   Cardiovascular: Positive for chest pain.  Gastrointestinal: Positive for nausea.  Allergic/Immunologic: Negative for immunocompromised  state.  Hematological: Does not bruise/bleed easily.  All other systems reviewed and are negative.    Physical Exam Updated Vital Signs BP 133/75 (BP Location: Right Arm)   Pulse 83   Temp 98.4 F (36.9 C) (Oral)   Resp 15   Ht 5\' 6"  (1.676 m)   Wt 93 kg   LMP 10/06/2012   SpO2 98%   BMI 33.09 kg/m   Physical Exam Vitals signs and nursing note reviewed.  Constitutional:      Appearance: She is well-developed.  HENT:     Head: Normocephalic and atraumatic.  Neck:     Musculoskeletal: Normal range of motion and neck  supple.  Cardiovascular:     Rate and Rhythm: Normal rate.  Pulmonary:     Effort: Pulmonary effort is normal.     Breath sounds: Decreased breath sounds present. No wheezing or rhonchi.  Abdominal:     General: Bowel sounds are normal.  Skin:    General: Skin is warm and dry.  Neurological:     Mental Status: She is alert and oriented to person, place, and time.      ED Treatments / Results  Labs (all labs ordered are listed, but only abnormal results are displayed) Labs Reviewed  BASIC METABOLIC PANEL  CBC  TROPONIN I (HIGH SENSITIVITY)  TROPONIN I (HIGH SENSITIVITY)    EKG None  Date: 10/22/2018  Rate: 100  Rhythm: normal sinus rhythm  QRS Axis: normal  Intervals: normal  ST/T Wave abnormalities: normal  Conduction Disutrbances: none  Narrative Interpretation: unremarkable       Radiology No results found.  Procedures Procedures (including critical care time)  Medications Ordered in ED Medications - No data to display   Initial Impression / Assessment and Plan / ED Course  I have reviewed the triage vital signs and the nursing notes.  Pertinent labs & imaging results that were available during my care of the patient were reviewed by me and considered in my medical decision making (see chart for details).  Clinical Course as of Oct 21 2204  Tue Oct 22, 2018  2153 Patient wants to go home. She reports that she has a teenage girl by herself.  I informed her the reasoning for the delay (multiple high acuity EMS traffic, level 1 trauma), and she is understanding, but states that she must leave.  Patient wants to leave against medical advice. Patient understands that her actions will lead to inadequate medical workup, and that she is at risk of complications of missed diagnosis, which includes morbidity and mortality.  Alternative options discussed - waiting until 10:30 for the next blood draw. Opportunity to change mind given. Patient is demonstrating  good capacity to make decision. Patient understands that she needs to return to the ER immediately if her symptoms get worse.     [AN]    Clinical Course User Index [AN] Varney Biles, MD    HEAR Score: 4  Patient comes in a chief complaint of chest pain.  Chest pain started yesterday and it was intermittent yesterday with some concerning features.  Today the patient has had constant soreness to her precordium.  Her history is some concerning features and her hear score is 4.  EKG is overall reassuring.  We will get delta troponin and reassess.  I went outside in the waiting room to assess the patient.  She has been advised to tell the waiting room staff immediately if she starts having severe crushing chest pain.  Suspicion for active MI is low given that on my assessment she was having chest discomfort with reassuring EKG.  Final Clinical Impressions(s) / ED Diagnoses   Final diagnoses:  Nonspecific chest pain  Precordial chest pain    ED Discharge Orders    None       Varney Biles, MD 10/22/18 2206

## 2018-10-22 NOTE — ED Triage Notes (Signed)
Patient presents to the Ed after seeing urgent care yesterday for the same thing. Patient still has a "sore" chest and was advised to be seen.  Patient comes today rather than yesterday because of the wait time.

## 2019-09-17 ENCOUNTER — Ambulatory Visit
Admission: EM | Admit: 2019-09-17 | Discharge: 2019-09-17 | Disposition: A | Discharge status: Home / Self Care | Payer: Federal, State, Local not specified - PPO | Attending: Emergency Medicine | Admitting: Emergency Medicine

## 2019-09-17 ENCOUNTER — Other Ambulatory Visit: Payer: Self-pay

## 2019-09-17 DIAGNOSIS — Z1152 Encounter for screening for COVID-19: Secondary | ICD-10-CM

## 2019-09-17 DIAGNOSIS — J069 Acute upper respiratory infection, unspecified: Secondary | ICD-10-CM

## 2019-09-17 MED ORDER — CETIRIZINE HCL 10 MG PO TABS: 10.0000 mg | ORAL_TABLET | Freq: Every day | ORAL | 0 refills | Status: DC

## 2019-09-17 MED ORDER — BENZONATATE 100 MG PO CAPS: 100.0000 mg | ORAL_CAPSULE | Freq: Three times a day (TID) | ORAL | 0 refills | Status: DC

## 2019-09-17 MED ORDER — FLUTICASONE PROPIONATE 50 MCG/ACT NA SUSP: 1.0000 | Freq: Every day | NASAL | 0 refills | Status: DC

## 2019-09-17 MED ORDER — DEXAMETHASONE 4 MG PO TABS: 4.0000 mg | ORAL_TABLET | Freq: Every day | ORAL | 0 refills | Status: DC

## 2019-09-17 NOTE — ED Provider Notes (Signed)
[place]   [id number] 09/17/19 Arrival Time: 8182   CC: COVID symptoms  SUBJECTIVE: History from: patient.  [name] is a 59 y.o. female presents to the urgent care for complaint of nasal congestion, cough over the past weekend.  Report the positive Covid exposure.  Denies sick exposure to COVID, flu or strep.  Denies recent travel.  Has tried OTC medication with no relief.  Denies aggravating factors.  Denies previous symptoms in the past.   Denies fever, chills, fatigue, sinus pain, rhinorrhea, sore throat, SOB, wheezing, chest pain, nausea, changes in bowel or bladder habits.     ROS: As per HPI.  All other pertinent ROS negative.     Past Medical History:  Diagnosis Date  . Chronic abdominal pain   . HELLP (hemolytic anemia/elev liver enzymes/low platelets in pregnancy)   . Meningeal tumor    benign  . S/P colonoscopy 2002, 2007   2002 Dr. Laural Golden: small external hemorrhoids otherwise normal. 2007 Dr. Tamala Julian: normal to cecum  . S/P endoscopy 2007   Dr. Irving Shows: gastritis (reviewed records)   Past Surgical History:  Procedure Laterality Date  . CESAREAN SECTION    . CHOLECYSTECTOMY    . COLONOSCOPY  09/07/2010   Procedure: COLONOSCOPY;  Surgeon: Daneil Dolin, MD;  Location: [facility];  Service: Endoscopy;  Laterality: N/A;  10:45  . ESOPHAGOGASTRODUODENOSCOPY  09/07/2010   Procedure: ESOPHAGOGASTRODUODENOSCOPY (EGD);  Surgeon: Daneil Dolin, MD;  Location: [facility];  Service: Endoscopy;  Laterality: N/A;  . exploratory laparoscopy     endometriosis  . TONSILLECTOMY     No Known Allergies No current facility-administered medications on file prior to encounter.   Current Outpatient Medications on File Prior to Encounter  Medication Sig Dispense Refill  . Multiple Vitamin (MULTIVITAMIN WITH MINERALS) TABS tablet Take 1 tablet by mouth daily.     Social History   Socioeconomic History  . Marital status: Married    Spouse name: Not on file   . Number of children: Not on file  . Years of education: Not on file  . Highest education level: Not on file  Occupational History  . Not on file  Tobacco Use  . Smoking status: Never Smoker  . Smokeless tobacco: Never Used  Substance and Sexual Activity  . Alcohol use: No  . Drug use: No  . Sexual activity: Yes  Other Topics Concern  . Not on file  Social History Narrative  . Not on file   Social Determinants of Health   Financial Resource Strain:   . Difficulty of Paying Living Expenses: Not on file  Food Insecurity:   . Worried About Charity fundraiser in the Last Year: Not on file  . Ran Out of Food in the Last Year: Not on file  Transportation Needs:   . Lack of Transportation (Medical): Not on file  . Lack of Transportation (Non-Medical): Not on file  Physical Activity:   . Days of Exercise per Week: Not on file  . Minutes of Exercise per Session: Not on file  Stress:   . Feeling of Stress : Not on file  Social Connections:   . Frequency of Communication with Friends and Family: Not on file  . Frequency of Social Gatherings with Friends and Family: Not on file  . Attends Religious Services: Not on file  . Active Member of Clubs or Organizations: Not on file  . Attends Archivist Meetings: Not on file  .  Marital Status: Not on file  Intimate Partner Violence:   . Fear of Current or Ex-Partner: Not on file  . Emotionally Abused: Not on file  . Physically Abused: Not on file  . Sexually Abused: Not on file   Family History  Problem Relation Age of Onset  . Diabetes Mother   . Coronary artery disease Mother   . Cancer Father   . Colon cancer Neg Hx     OBJECTIVE:  Vitals:   09/17/19 1821  BP: 122/86  Pulse: 98  Resp: 18  Temp: 98.1 F (36.7 C)  SpO2: 98%     General appearance: alert; appears fatigued, but nontoxic; speaking in full sentences and tolerating own secretions HEENT: NCAT; Ears: EACs clear, TMs pearly gray; Eyes: PERRL.  EOM  grossly intact. Sinuses: nontender; Nose: nares patent without rhinorrhea, Throat: oropharynx clear, tonsils non erythematous or enlarged, uvula midline  Neck: supple without LAD Lungs: unlabored respirations, symmetrical air entry; cough: moderate; no respiratory distress; CTAB Heart: regular rate and rhythm.  Radial pulses 2+ symmetrical bilaterally Skin: warm and dry Psychological: alert and cooperative; normal mood and affect  LABS:  No results found for this or any previous visit (from the past 24 hour(s)).   ASSESSMENT & PLAN:  1. Viral URI with cough   2. Encounter for screening for COVID-19     Meds ordered this encounter  Medications  . cetirizine (ZYRTEC ALLERGY) 10 MG tablet    Sig: Take 1 tablet (10 mg total) by mouth daily.    Dispense:  30 tablet    Refill:  0  . fluticasone (FLONASE) 50 MCG/ACT nasal spray    Sig: Place 1 spray into both nostrils daily for 14 days.    Dispense:  16 g    Refill:  0  . dexamethasone (DECADRON) 4 MG tablet    Sig: Take 1 tablet (4 mg total) by mouth daily.    Dispense:  7 tablet    Refill:  0  . benzonatate (TESSALON) 100 MG capsule    Sig: Take 1 capsule (100 mg total) by mouth every 8 (eight) hours.    Dispense:  30 capsule    Refill:  0    Discharge instructions  COVID testing ordered.  It will take between 2-7 days for test results.  Someone will contact you regarding abnormal results.    In the meantime: You should remain isolated in your home for 10 days from symptom onset AND greater than 24 hours after symptoms resolution (absence of fever without the use of fever-reducing medication and improvement in respiratory symptoms), whichever is longer Get plenty of rest and push fluids Tessalon Perles prescribed for cough Zyrtec for nasal congestion, runny nose, and/or sore throat Flonase for nasal congestion and runny nose Discharge was prescribed Use medications daily for symptom relief Use OTC medications like  ibuprofen or tylenol as needed fever or pain Call or go to the ED if you have any new or worsening symptoms such as fever, worsening cough, shortness of breath, chest tightness, chest pain, turning blue, changes in mental status, etc...   Reviewed expectations re: course of current medical issues. Questions answered. Outlined signs and symptoms indicating need for more acute intervention. Patient verbalized understanding. After Visit Summary given.      Note: This document was prepared using Dragon voice recognition software and may include unintentional dictation errors.    Emerson Monte, FNP 09/17/19 1912

## 2019-09-17 NOTE — ED Triage Notes (Signed)
Pt presents with nasal congestion and cough that developed over the weekend, positive covid exposure

## 2019-09-17 NOTE — Discharge Instructions (Addendum)
COVID testing ordered.  It will take between 2-7 days for test results.  Someone will contact you regarding abnormal results.    In the meantime: You should remain isolated in your home for 10 days from symptom onset AND greater than 24 hours after symptoms resolution (absence of fever without the use of fever-reducing medication and improvement in respiratory symptoms), whichever is longer Get plenty of rest and push fluids Tessalon Perles prescribed for cough Zyrtec for nasal congestion, runny nose, and/or sore throat Flonase for nasal congestion and runny nose Discharge was prescribed Use medications daily for symptom relief Use OTC medications like ibuprofen or tylenol as needed fever or pain Call or go to the ED if you have any new or worsening symptoms such as fever, worsening cough, shortness of breath, chest tightness, chest pain, turning blue, changes in mental status, etc..Marland Kitchen

## 2019-09-19 LAB — SARS-COV-2, NAA 2 DAY TAT

## 2019-09-19 LAB — NOVEL CORONAVIRUS, NAA: SARS-CoV-2, NAA: DETECTED — AB

## 2019-09-20 ENCOUNTER — Telehealth: Payer: Self-pay | Admitting: Internal Medicine

## 2019-09-20 ENCOUNTER — Other Ambulatory Visit (HOSPITAL_COMMUNITY): Payer: Self-pay | Admitting: Family

## 2019-09-20 DIAGNOSIS — U071 COVID-19: Secondary | ICD-10-CM

## 2019-09-20 NOTE — Progress Notes (Signed)
I connected by phone with Virl Diamond on 09/20/2019 at 5:26 PM to discuss the potential use of a new treatment for mild to moderate COVID-19 viral infection in non-hospitalized patients.  This patient is a 59 y.o. female that meets the FDA criteria for Emergency Use Authorization of COVID monoclonal antibody casirivimab/imdevimab.  Has a (+) direct SARS-CoV-2 viral test result  Has mild or moderate COVID-19   Is NOT hospitalized due to COVID-19  Is within 10 days of symptom onset  Has at least one of the high risk factor(s) for progression to severe COVID-19 and/or hospitalization as defined in EUA.  Specific high risk criteria : Neurodevelopmental disorder Other disorder- hematologic.  Symptoms of congestion and cough began 09/13/19.  I have spoken and communicated the following to the patient or parent/caregiver regarding COVID monoclonal antibody treatment:  1. FDA has authorized the emergency use for the treatment of mild to moderate COVID-19 in adults and pediatric patients with positive results of direct SARS-CoV-2 viral testing who are 55 years of age and older weighing at least 40 kg, and who are at high risk for progressing to severe COVID-19 and/or hospitalization.  2. The significant known and potential risks and benefits of COVID monoclonal antibody, and the extent to which such potential risks and benefits are unknown.  3. Information on available alternative treatments and the risks and benefits of those alternatives, including clinical trials.  4. Patients treated with COVID monoclonal antibody should continue to self-isolate and use infection control measures (e.g., wear mask, isolate, social distance, avoid sharing personal items, clean and disinfect "high touch" surfaces, and frequent handwashing) according to CDC guidelines.   5. The patient or parent/caregiver has the option to accept or refuse COVID monoclonal antibody treatment.  After reviewing this information  with the patient, The patient agreed to proceed with receiving casirivimab\imdevimab infusion and will be provided a copy of the Fact sheet prior to receiving the infusion. Asencion Gowda 09/20/2019 5:26 PM

## 2019-09-20 NOTE — Telephone Encounter (Signed)
Called to Discuss with patient about Covid symptoms and the use of the monoclonal antibody infusion for those with mild to moderate Covid symptoms and at a high risk of hospitalization.     Pt appears to qualify for this infusion due to co-morbid conditions and/or a member of an at-risk group in accordance with the FDA Emergency Use Authorization. BMI >25. Need to determine duration of symptoms.    Unable to reach pt. No MyChart set up. Text sent via D[employer] to call MAB infusion hotline if interested.   Nemiah Commander, NP [place]

## 2019-09-21 ENCOUNTER — Ambulatory Visit (HOSPITAL_COMMUNITY)
Admission: RE | Admit: 2019-09-21 | Discharge: 2019-09-21 | Disposition: A | Discharge status: Home / Self Care | Payer: Federal, State, Local not specified - PPO | Source: Ambulatory Visit | Attending: Pulmonary Disease | Admitting: Pulmonary Disease

## 2019-09-21 DIAGNOSIS — U071 COVID-19: Secondary | ICD-10-CM | POA: Insufficient documentation

## 2019-09-21 DIAGNOSIS — F89 Unspecified disorder of psychological development: Secondary | ICD-10-CM | POA: Insufficient documentation

## 2019-09-21 DIAGNOSIS — D759 Disease of blood and blood-forming organs, unspecified: Secondary | ICD-10-CM | POA: Diagnosis not present

## 2019-09-21 MED ORDER — ALBUTEROL SULFATE HFA 108 (90 BASE) MCG/ACT IN AERS: 2.0000 | INHALATION_SPRAY | Freq: Once | RESPIRATORY_TRACT | Status: DC | PRN

## 2019-09-21 MED ORDER — FAMOTIDINE IN NACL 20-0.9 MG/50ML-% IV SOLN: 20.0000 mg | Freq: Once | INTRAVENOUS | Status: DC | PRN

## 2019-09-21 MED ORDER — METHYLPREDNISOLONE SODIUM SUCC 125 MG IJ SOLR: 125.0000 mg | Freq: Once | INTRAMUSCULAR | Status: DC | PRN

## 2019-09-21 MED ORDER — SODIUM CHLORIDE 0.9 % IV SOLN: INTRAVENOUS | Status: DC | PRN

## 2019-09-21 MED ORDER — EPINEPHRINE 0.3 MG/0.3ML IJ SOAJ: 0.3000 mg | Freq: Once | INTRAMUSCULAR | Status: DC | PRN

## 2019-09-21 MED ORDER — DIPHENHYDRAMINE HCL 50 MG/ML IJ SOLN: 50.0000 mg | Freq: Once | INTRAMUSCULAR | Status: DC | PRN

## 2019-09-21 MED ORDER — SODIUM CHLORIDE 0.9 % IV SOLN
1200.0000 mg | Freq: Once | INTRAVENOUS | Status: AC
  Administered 2019-09-21: 1200 mg via INTRAVENOUS

## 2019-09-21 NOTE — Discharge Instructions (Signed)

## 2019-09-21 NOTE — Progress Notes (Signed)
°  Diagnosis: COVID-19 ° °Physician: Wright, MD ° °Procedure: Covid Infusion Clinic Med: casirivimab\imdevimab infusion - Provided patient with casirivimab\imdevimab fact sheet for patients, parents and caregivers prior to infusion. ° °Complications: No immediate complications noted. ° °Discharge: Discharged home  ° °[name] R Azaya Goedde °09/21/2019 ° ° °

## 2020-05-04 DIAGNOSIS — G43109 Migraine with aura, not intractable, without status migrainosus: Secondary | ICD-10-CM | POA: Diagnosis not present

## 2020-05-04 DIAGNOSIS — H2513 Age-related nuclear cataract, bilateral: Secondary | ICD-10-CM | POA: Diagnosis not present

## 2020-05-04 DIAGNOSIS — H1045 Other chronic allergic conjunctivitis: Secondary | ICD-10-CM | POA: Diagnosis not present

## 2020-05-04 DIAGNOSIS — H04123 Dry eye syndrome of bilateral lacrimal glands: Secondary | ICD-10-CM | POA: Diagnosis not present

## 2020-07-09 DIAGNOSIS — L729 Follicular cyst of the skin and subcutaneous tissue, unspecified: Secondary | ICD-10-CM | POA: Diagnosis not present

## 2020-07-09 DIAGNOSIS — Z0000 Encounter for general adult medical examination without abnormal findings: Secondary | ICD-10-CM | POA: Diagnosis not present

## 2020-07-09 DIAGNOSIS — D1722 Benign lipomatous neoplasm of skin and subcutaneous tissue of left arm: Secondary | ICD-10-CM | POA: Diagnosis not present

## 2020-07-09 DIAGNOSIS — Z13220 Encounter for screening for lipoid disorders: Secondary | ICD-10-CM | POA: Diagnosis not present

## 2020-07-09 DIAGNOSIS — Z0001 Encounter for general adult medical examination with abnormal findings: Secondary | ICD-10-CM | POA: Diagnosis not present

## 2020-07-09 DIAGNOSIS — E559 Vitamin D deficiency, unspecified: Secondary | ICD-10-CM | POA: Diagnosis not present

## 2021-01-05 ENCOUNTER — Telehealth: Payer: Self-pay | Admitting: *Deleted

## 2021-01-05 NOTE — Telephone Encounter (Signed)
error 

## 2021-05-31 DIAGNOSIS — B889 Infestation, unspecified: Secondary | ICD-10-CM | POA: Diagnosis not present

## 2022-04-11 ENCOUNTER — Ambulatory Visit (INDEPENDENT_AMBULATORY_CARE_PROVIDER_SITE_OTHER): Payer: Federal, State, Local not specified - PPO

## 2022-04-11 ENCOUNTER — Ambulatory Visit
Admission: EM | Admit: 2022-04-11 | Discharge: 2022-04-11 | Disposition: A | Discharge status: Home / Self Care | Payer: Federal, State, Local not specified - PPO | Attending: Nurse Practitioner | Admitting: Nurse Practitioner

## 2022-04-11 DIAGNOSIS — R059 Cough, unspecified: Secondary | ICD-10-CM

## 2022-04-11 DIAGNOSIS — R0602 Shortness of breath: Secondary | ICD-10-CM | POA: Diagnosis not present

## 2022-04-11 DIAGNOSIS — J069 Acute upper respiratory infection, unspecified: Secondary | ICD-10-CM | POA: Diagnosis not present

## 2022-04-11 MED ORDER — PROMETHAZINE-DM 6.25-15 MG/5ML PO SYRP: 5.0000 mL | ORAL_SOLUTION | Freq: Every day | ORAL | 0 refills | Status: DC

## 2022-04-11 MED ORDER — BENZONATATE 100 MG PO CAPS: 100.0000 mg | ORAL_CAPSULE | Freq: Three times a day (TID) | ORAL | 0 refills | Status: DC | PRN

## 2022-04-11 NOTE — ED Provider Notes (Signed)
[facility]    CSN: [id number] Arrival date & time: 04/11/22  1524      History   Chief Complaint Chief Complaint  Patient presents with   Cough    HPI CHEYLYNN TENNESSEE is a 62 y.o. female.   Patient presents today with 4-day history of night sweats, chills, congested and dry cough, shortness of breath after coughing, chest tightness and chest congestion, runny and stuffy nose, sore throat that has improved, bilateral ear pain, decreased appetite, and fatigue.  She denies known fevers, chest pain, headache, abdominal pain, nausea/vomiting, and diarrhea.  Has been using nasal saline solution, Zyrtec, and ibuprofen for symptoms which seems to help minimally.  No known sick contacts.  Reports she is mostly concerned about cough and how it is affecting her sleep at night.    Past Medical History:  Diagnosis Date   Chronic abdominal pain    HELLP (hemolytic anemia/elev liver enzymes/low platelets in pregnancy)    Meningeal tumor    benign   S/P colonoscopy 2002, 2007   2002 Dr. [name]: small external hemorrhoids otherwise normal. 2007 Dr. [name]: normal to cecum   S/P endoscopy 2007   Dr. [name]: gastritis (reviewed records)    Patient Active Problem List   Diagnosis Date Noted   Abdominal pain 08/30/2010   Rectal bleeding 08/30/2010    Past Surgical History:  Procedure Laterality Date   CESAREAN SECTION     CHOLECYSTECTOMY     COLONOSCOPY  09/07/2010   Procedure: COLONOSCOPY;  Surgeon: Corbin Ade, MD;  Location: [facility];  Service: Endoscopy;  Laterality: N/A;  10:45   ESOPHAGOGASTRODUODENOSCOPY  09/07/2010   Procedure: ESOPHAGOGASTRODUODENOSCOPY (EGD);  Surgeon: Corbin Ade, MD;  Location: [facility];  Service: Endoscopy;  Laterality: N/A;   exploratory laparoscopy     endometriosis   TONSILLECTOMY      OB History   No obstetric history on file.      Home Medications    Prior to Admission medications   Medication Sig Start Date End  Date Taking? Authorizing Provider  promethazine-dextromethorphan (PROMETHAZINE-DM) 6.25-15 MG/5ML syrup Take 5 mLs by mouth at bedtime. Do not take with alcohol or while driving or operating heavy machinery.  May cause drowsiness. 04/11/22  Yes Valentino Nose, NP  benzonatate (TESSALON) 100 MG capsule Take 1 capsule (100 mg total) by mouth 3 (three) times daily as needed for cough. Do not take with alcohol or while driving or operating heavy machinery.  May cause drowsiness. 04/11/22   Valentino Nose, NP  cetirizine (ZYRTEC ALLERGY) 10 MG tablet Take 1 tablet (10 mg total) by mouth daily. 09/17/19   Avegno, Zachery Dakins, FNP  fluticasone (FLONASE) 50 MCG/ACT nasal spray Place 1 spray into both nostrils daily for 14 days. 09/17/19 10/01/19  Avegno, Zachery Dakins, FNP  Multiple Vitamin (MULTIVITAMIN WITH MINERALS) TABS tablet Take 1 tablet by mouth daily.    [provider]    Family History Family History  Problem Relation Age of Onset   Diabetes Mother    Coronary artery disease Mother    Cancer Father    Colon cancer Neg Hx     Social History Social History   Tobacco Use   Smoking status: Never   Smokeless tobacco: Never  Substance Use Topics   Alcohol use: No   Drug use: No     Allergies   Patient has no known allergies.   Review of Systems Review of Systems Per  HPI  Physical Exam Triage Vital Signs ED Triage Vitals  Enc Vitals Group     BP 04/11/22 1540 119/74     Pulse Rate 04/11/22 1540 (!) 103     Resp --      Temp 04/11/22 1540 98.7 F (37.1 C)     Temp Source 04/11/22 1540 Oral     SpO2 04/11/22 1540 94 %     Weight --      Height --      Head Circumference --      Peak Flow --      Pain Score 04/11/22 1544 0     Pain Loc --      Pain Edu? --      Excl. in GC? --    No data found.  Updated Vital Signs BP 119/74 (BP Location: Right Arm)   Pulse (!) 103   Temp 98.7 F (37.1 C) (Oral)   LMP 09/17/2012   SpO2 94%   Visual Acuity Right Eye  Distance:   Left Eye Distance:   Bilateral Distance:    Right Eye Near:   Left Eye Near:    Bilateral Near:     Physical Exam Vitals and nursing note reviewed.  Constitutional:      General: She is not in acute distress.    Appearance: Normal appearance. She is not ill-appearing or toxic-appearing.  HENT:     Head: Normocephalic and atraumatic.     Right Ear: Ear canal and external ear normal. A middle ear effusion is present. Tympanic membrane is not erythematous.     Left Ear: Ear canal and external ear normal. A middle ear effusion is present. Tympanic membrane is not erythematous.     Nose: Congestion and rhinorrhea present.     Mouth/Throat:     Mouth: Mucous membranes are moist.     Pharynx: Oropharynx is clear. Posterior oropharyngeal erythema present. No oropharyngeal exudate.  Eyes:     General: No scleral icterus.    Extraocular Movements: Extraocular movements intact.  Cardiovascular:     Rate and Rhythm: Normal rate and regular rhythm.  Pulmonary:     Effort: Pulmonary effort is normal. No respiratory distress.     Breath sounds: Normal breath sounds. No wheezing, rhonchi or rales.  Abdominal:     General: Abdomen is flat. Bowel sounds are normal. There is no distension.     Palpations: Abdomen is soft.  Musculoskeletal:     Cervical back: Normal range of motion and neck supple.  Lymphadenopathy:     Cervical: No cervical adenopathy.  Skin:    General: Skin is warm and dry.     Coloration: Skin is not jaundiced or pale.     Findings: No erythema or rash.  Neurological:     Mental Status: She is alert and oriented to person, place, and time.  Psychiatric:        Behavior: Behavior is cooperative.      UC Treatments / Results  Labs (all labs ordered are listed, but only abnormal results are displayed) Labs Reviewed - No data to display  EKG   Radiology DG Chest 2 View  Result Date: 04/11/2022 CLINICAL DATA:  Cough and shortness of breath for 1 week.  EXAM: CHEST - 2 VIEW COMPARISON:  None Available. FINDINGS: The cardiomediastinal silhouette is normal There is no focal consolidation or pulmonary edema. There is no pleural effusion or pneumothorax There is no acute osseous abnormality. Cholecystectomy clips are noted. IMPRESSION:  No radiographic evidence of acute cardiopulmonary process. Electronically Signed   By: Lesia Hausen M.D.   On: 04/11/2022 16:09    Procedures Procedures (including critical care time)  Medications Ordered in UC Medications - No data to display  Initial Impression / Assessment and Plan / UC Course  I have reviewed the triage vital signs and the nursing notes.  Pertinent labs & imaging results that were available during my care of the patient were reviewed by me and considered in my medical decision making (see chart for details).   Patient is well-appearing, normotensive, afebrile, oxygenating well on room air.  Patient is mildly tachycardic in triage today.  1. Viral URI with cough Suspect viral etiology Vital signs and examination today are reassuring Chest x-ray today is negative for acute cardiopulmonary process Patient declines COVID-19 testing at this time Supportive care discussed Start cough suppressant ER and return precautions discussed Note given for work  The patient was given the opportunity to ask questions.  All questions answered to their satisfaction.  The patient is in agreement to this plan.    Final Clinical Impressions(s) / UC Diagnoses   Final diagnoses:  Viral URI with cough     Discharge Instructions      You have a viral upper respiratory infection.  Symptoms should improve over the next week to 10 days.  If you develop chest pain or shortness of breath, go to the emergency room.  The chest xray today does not show pneumonia.  Some things that can make you feel better are: - Increased rest - Increasing fluid with water/sugar free electrolytes - Acetaminophen and ibuprofen  as needed for fever/pain - Salt water gargling, chloraseptic spray and throat lozenges - OTC guaifenesin (Mucinex) 600 mg twice daily - Saline sinus flushes or a neti pot - Humidifying the air -Tessalon Perles during the day as needed for dry cough and cough syrup at nighttime as needed for dry cough     ED Prescriptions     Medication Sig Dispense Auth. Provider   benzonatate (TESSALON) 100 MG capsule Take 1 capsule (100 mg total) by mouth 3 (three) times daily as needed for cough. Do not take with alcohol or while driving or operating heavy machinery.  May cause drowsiness. 30 capsule Cathlean Marseilles A, NP   promethazine-dextromethorphan (PROMETHAZINE-DM) 6.25-15 MG/5ML syrup Take 5 mLs by mouth at bedtime. Do not take with alcohol or while driving or operating heavy machinery.  May cause drowsiness. 118 mL Valentino Nose, NP      PDMP not reviewed this encounter.   Valentino Nose, NP 04/11/22 [phone]

## 2022-04-11 NOTE — ED Triage Notes (Addendum)
Pt c/o cough, congestion, pt states she had a virus on Friday and it usually gets better in a few days but she hasn't she had chills and sweats, coughing fits at night. Can feel it in her chest   Pt hs tried otc meds, gargling warm salt water, saline solutions none have really helped.

## 2022-04-11 NOTE — Discharge Instructions (Addendum)
You have a viral upper respiratory infection.  Symptoms should improve over the next week to 10 days.  If you develop chest pain or shortness of breath, go to the emergency room.  The chest xray today does not show pneumonia.  Some things that can make you feel better are: - Increased rest - Increasing fluid with water/sugar free electrolytes - Acetaminophen and ibuprofen as needed for fever/pain - Salt water gargling, chloraseptic spray and throat lozenges - OTC guaifenesin (Mucinex) 600 mg twice daily - Saline sinus flushes or a neti pot - Humidifying the air -Tessalon Perles during the day as needed for dry cough and cough syrup at nighttime as needed for dry cough

## 2022-10-02 DIAGNOSIS — M9905 Segmental and somatic dysfunction of pelvic region: Secondary | ICD-10-CM | POA: Diagnosis not present

## 2022-10-02 DIAGNOSIS — M9902 Segmental and somatic dysfunction of thoracic region: Secondary | ICD-10-CM | POA: Diagnosis not present

## 2022-10-02 DIAGNOSIS — M25552 Pain in left hip: Secondary | ICD-10-CM | POA: Diagnosis not present

## 2022-10-02 DIAGNOSIS — M9903 Segmental and somatic dysfunction of lumbar region: Secondary | ICD-10-CM | POA: Diagnosis not present

## 2022-10-19 DIAGNOSIS — M9902 Segmental and somatic dysfunction of thoracic region: Secondary | ICD-10-CM | POA: Diagnosis not present

## 2022-10-19 DIAGNOSIS — M9905 Segmental and somatic dysfunction of pelvic region: Secondary | ICD-10-CM | POA: Diagnosis not present

## 2022-10-19 DIAGNOSIS — M25552 Pain in left hip: Secondary | ICD-10-CM | POA: Diagnosis not present

## 2022-10-19 DIAGNOSIS — M9903 Segmental and somatic dysfunction of lumbar region: Secondary | ICD-10-CM | POA: Diagnosis not present

## 2022-10-27 DIAGNOSIS — M9902 Segmental and somatic dysfunction of thoracic region: Secondary | ICD-10-CM | POA: Diagnosis not present

## 2022-10-27 DIAGNOSIS — M9905 Segmental and somatic dysfunction of pelvic region: Secondary | ICD-10-CM | POA: Diagnosis not present

## 2022-10-27 DIAGNOSIS — M9903 Segmental and somatic dysfunction of lumbar region: Secondary | ICD-10-CM | POA: Diagnosis not present

## 2022-10-27 DIAGNOSIS — M25552 Pain in left hip: Secondary | ICD-10-CM | POA: Diagnosis not present

## 2023-03-13 ENCOUNTER — Encounter: Payer: Self-pay | Admitting: Physician Assistant

## 2023-03-13 ENCOUNTER — Ambulatory Visit: Payer: Federal, State, Local not specified - PPO | Admitting: Physician Assistant

## 2023-03-13 VITALS — BP 121/85 | Ht 66.0 in | Wt 209.8 lb

## 2023-03-13 DIAGNOSIS — R198 Other specified symptoms and signs involving the digestive system and abdomen: Secondary | ICD-10-CM | POA: Diagnosis not present

## 2023-03-13 DIAGNOSIS — Z1211 Encounter for screening for malignant neoplasm of colon: Secondary | ICD-10-CM | POA: Diagnosis not present

## 2023-03-13 NOTE — Assessment & Plan Note (Addendum)
 Patient is stable today.  Exam without abnormal findings, abdomen soft and nontender, normal bowel sounds, no masses palpated.  Patient encouraged to continue with increased fiber foods as well as probiotics.  Handouts given on at home management of diarrhea and constipation.  Referral placed to Dr. [name] per her request.  Referral also placed for colonoscopy as patient is overdue.  Lab work today to include lipase, CMP, CBC.  Patient advised to follow-up for worsening abdominal pain, or proceed to ER for care if she notices blood in her stool, non-intractable pain, worsening symptoms.  Patient agreeable to plan.  Will plan to follow-up in about 2 months for physical unless otherwise needed sooner.

## 2023-03-13 NOTE — Progress Notes (Signed)
 New Patient Office Visit  Subjective    Patient ID: [name], female    DOB: [birth year]  Age: 63 y.o. MRN: [id number]  CC:  Chief Complaint  Patient presents with   Establish Care    Stomach issues- needs new referral to Dr [name]    HPI [name] presents to establish care  Patient presents today with complaints of alternating diarrhea and constipation.  She has no significant past medical history and last saw primary care provider and 2020.  She states for approximately 3 weeks she was experiencing diarrhea.  However recently started with constipation.  She states she eats a fairly balanced diet with fruits and vegetables for fiber.  She states she takes Benefiber as needed and drinks plenty of water.  She denies abdominal pain, nausea, vomiting but endorses some cramping with bowel movements.  She denies blood in the stool.  She requests lab work and referral to GI to see Dr. [name], as she has seen him in the past.  She is due for colonoscopy as well.  Outpatient Encounter Medications as of 03/13/2023  Medication Sig   [DISCONTINUED] benzonatate (TESSALON) 100 MG capsule Take 1 capsule (100 mg total) by mouth 3 (three) times daily as needed for cough. Do not take with alcohol or while driving or operating heavy machinery.  May cause drowsiness.   [DISCONTINUED] cetirizine (ZYRTEC ALLERGY) 10 MG tablet Take 1 tablet (10 mg total) by mouth daily.   [DISCONTINUED] fluticasone (FLONASE) 50 MCG/ACT nasal spray Place 1 spray into both nostrils daily for 14 days.   [DISCONTINUED] Multiple Vitamin (MULTIVITAMIN WITH MINERALS) TABS tablet Take 1 tablet by mouth daily.   [DISCONTINUED] promethazine-dextromethorphan (PROMETHAZINE-DM) 6.25-15 MG/5ML syrup Take 5 mLs by mouth at bedtime. Do not take with alcohol or while driving or operating heavy machinery.  May cause drowsiness.   No facility-administered encounter medications on file as of 03/13/2023.    Past Medical History:   Diagnosis Date   Chronic abdominal pain    HELLP (hemolytic anemia/elev liver enzymes/low platelets in pregnancy)    Meningeal tumor    benign   S/P colonoscopy 2002, 2007   2002 Dr. [name]: small external hemorrhoids otherwise normal. 2007 Dr. [name]: normal to cecum   S/P endoscopy 2007   Dr. [name]: gastritis (reviewed records)    Past Surgical History:  Procedure Laterality Date   CESAREAN SECTION     CHOLECYSTECTOMY     COLONOSCOPY  09/07/2010   Procedure: COLONOSCOPY;  Surgeon: Corbin Ade, MD;  Location: [facility];  Service: Endoscopy;  Laterality: N/A;  10:45   ESOPHAGOGASTRODUODENOSCOPY  09/07/2010   Procedure: ESOPHAGOGASTRODUODENOSCOPY (EGD);  Surgeon: Corbin Ade, MD;  Location: [facility];  Service: Endoscopy;  Laterality: N/A;   exploratory laparoscopy     endometriosis   TONSILLECTOMY      Family History  Problem Relation Age of Onset   Diabetes Mother    Coronary artery disease Mother    Cancer Father    Colon cancer Neg Hx     Social History   Socioeconomic History   Marital status: Married    Spouse name: Not on file   Number of children: Not on file   Years of education: Not on file   Highest education level: Not on file  Occupational History   Not on file  Tobacco Use   Smoking status: Never   Smokeless tobacco: Never  Substance and Sexual Activity   Alcohol use:  No   Drug use: No   Sexual activity: Yes  Other Topics Concern   Not on file  Social History Narrative   Not on file   Social Drivers of Health   Financial Resource Strain: Not on file  Food Insecurity: Not on file  Transportation Needs: Not on file  Physical Activity: Not on file  Stress: Not on file  Social Connections: Not on file  Intimate Partner Violence: Not on file    Review of Systems  Constitutional:  Negative for fever, malaise/fatigue and weight loss.  Respiratory:  Negative for shortness of breath.   Cardiovascular:  Negative for chest pain.   Gastrointestinal:  Positive for constipation and diarrhea. Negative for abdominal pain, blood in stool, heartburn, melena, nausea and vomiting.  Genitourinary:  Negative for dysuria.        Objective    BP 121/85   Ht 5\' 6"  (1.676 m)   Wt 209 lb 12.8 oz (95.2 kg)   LMP 09/17/2012   BMI 33.86 kg/m   Physical Exam Vitals reviewed.  Constitutional:      General: She is not in acute distress.    Appearance: Normal appearance. She is obese.  Eyes:     Extraocular Movements: Extraocular movements intact.     Conjunctiva/sclera: Conjunctivae normal.  Cardiovascular:     Rate and Rhythm: Normal rate and regular rhythm.     Heart sounds: No murmur heard. Pulmonary:     Effort: Pulmonary effort is normal.     Breath sounds: Normal breath sounds. No stridor.  Abdominal:     General: Abdomen is flat. Bowel sounds are normal.     Palpations: Abdomen is soft.     Tenderness: There is no abdominal tenderness.  Musculoskeletal:        General: Normal range of motion.  Lymphadenopathy:     Cervical: No cervical adenopathy.  Skin:    General: Skin is warm and dry.     Capillary Refill: Capillary refill takes less than 2 seconds.  Neurological:     General: No focal deficit present.     Mental Status: She is alert and oriented to person, place, and time.  Psychiatric:        Mood and Affect: Mood normal.        Behavior: Behavior normal.        Assessment & Plan:  Alternating constipation and diarrhea Assessment & Plan: Patient is stable today.  Exam without abnormal findings, abdomen soft and nontender, normal bowel sounds, no masses palpated.  Patient encouraged to continue with increased fiber foods as well as probiotics.  Handouts given on at home management of diarrhea and constipation.  Referral placed to Dr. [name] per her request.  Referral also placed for colonoscopy as patient is overdue.  Lab work today to include lipase, CMP, CBC.  Patient advised to follow-up for  worsening abdominal pain, or proceed to ER for care if she notices blood in her stool, non-intractable pain, worsening symptoms.  Patient agreeable to plan.  Will plan to follow-up in about 2 months for physical unless otherwise needed sooner.  Orders: -     Ambulatory referral to Gastroenterology -     CBC with Differential/Platelet -     CMP14+EGFR -     Lipase  Colon cancer screening -     Ambulatory referral to Gastroenterology    Return in about 2 months (around 05/13/2023) for well women .   Toni Amend Thuan Tippett, PA-C

## 2023-03-14 LAB — CBC WITH DIFFERENTIAL/PLATELET
Basophils Absolute: 0.1 10*3/uL (ref 0.0–0.2)
Basos: 1 %
EOS (ABSOLUTE): 0.1 10*3/uL (ref 0.0–0.4)
Eos: 1 %
Hematocrit: 41.9 % (ref 34.0–46.6)
Hemoglobin: 14.3 g/dL (ref 11.1–15.9)
Immature Grans (Abs): 0 10*3/uL (ref 0.0–0.1)
Immature Granulocytes: 0 %
Lymphocytes Absolute: 2 10*3/uL (ref 0.7–3.1)
Lymphs: 28 %
MCH: 31.2 pg (ref 26.6–33.0)
MCHC: 34.1 g/dL (ref 31.5–35.7)
MCV: 92 fL (ref 79–97)
Monocytes Absolute: 0.5 10*3/uL (ref 0.1–0.9)
Monocytes: 7 %
Neutrophils Absolute: 4.6 10*3/uL (ref 1.4–7.0)
Neutrophils: 63 %
Platelets: 252 10*3/uL (ref 150–450)
RBC: 4.58 x10E6/uL (ref 3.77–5.28)
RDW: 12.2 % (ref 11.7–15.4)
WBC: 7.3 10*3/uL (ref 3.4–10.8)

## 2023-03-14 LAB — CMP14+EGFR
ALT: 19 IU/L (ref 0–32)
AST: 16 IU/L (ref 0–40)
Albumin: 4.5 g/dL (ref 3.9–4.9)
Alkaline Phosphatase: 86 IU/L (ref 44–121)
BUN/Creatinine Ratio: 15 (ref 12–28)
BUN: 13 mg/dL (ref 8–27)
Bilirubin Total: 0.4 mg/dL (ref 0.0–1.2)
CO2: 23 mmol/L (ref 20–29)
Calcium: 9.6 mg/dL (ref 8.7–10.3)
Chloride: 104 mmol/L (ref 96–106)
Creatinine, Ser: 0.85 mg/dL (ref 0.57–1.00)
Globulin, Total: 2 g/dL (ref 1.5–4.5)
Glucose: 79 mg/dL (ref 70–99)
Potassium: 4.2 mmol/L (ref 3.5–5.2)
Sodium: 142 mmol/L (ref 134–144)
Total Protein: 6.5 g/dL (ref 6.0–8.5)
eGFR: 78 mL/min/{1.73_m2} (ref >59)

## 2023-03-14 LAB — LIPASE: Lipase: 31 U/L (ref 14–72)

## 2023-03-29 ENCOUNTER — Encounter: Payer: Self-pay | Admitting: *Deleted

## 2023-05-16 ENCOUNTER — Encounter: Admitting: Physician Assistant

## 2023-05-21 ENCOUNTER — Ambulatory Visit (INDEPENDENT_AMBULATORY_CARE_PROVIDER_SITE_OTHER): Admitting: Physician Assistant

## 2023-05-21 ENCOUNTER — Encounter: Payer: Self-pay | Admitting: Physician Assistant

## 2023-05-21 VITALS — BP 94/61 | HR 82 | Temp 99.0°F | Ht 66.0 in | Wt 206.6 lb

## 2023-05-21 DIAGNOSIS — Z6833 Body mass index (BMI) 33.0-33.9, adult: Secondary | ICD-10-CM

## 2023-05-21 DIAGNOSIS — Z1329 Encounter for screening for other suspected endocrine disorder: Secondary | ICD-10-CM

## 2023-05-21 DIAGNOSIS — Z13220 Encounter for screening for lipoid disorders: Secondary | ICD-10-CM

## 2023-05-21 DIAGNOSIS — Z0000 Encounter for general adult medical examination without abnormal findings: Secondary | ICD-10-CM

## 2023-05-21 DIAGNOSIS — Z0001 Encounter for general adult medical examination with abnormal findings: Secondary | ICD-10-CM | POA: Diagnosis not present

## 2023-05-21 NOTE — Progress Notes (Signed)
 Complete physical exam  Patient: [name]   DOB: 1960/03/22   63 y.o. Female  MRN: [id number]  Subjective:     No chief complaint on file.   [name] is a 63 y.o. female who presents today for a complete physical exam. She reports consuming a general diet. Starting a daily exercise routine with her daughter to include 30-60 minutes of walking daily She generally feels well. She reports sleeping well. She does not have additional problems to discuss today.    Most recent fall risk assessment:    05/21/2023    4:18 PM  Fall Risk   Falls in the past year? 0     Most recent depression screenings:    05/21/2023    4:18 PM 03/13/2023    8:40 AM  PHQ 2/9 Scores  PHQ - 2 Score 0 0  PHQ- 9 Score 2     Vision:Within last year and Dental: No current dental problems and Receives regular dental care  Patient Care Team: Xzandria Clevinger, Alayne Hubert as PCP - General (Physician Assistant) Riley Cheadle, Windsor Hatcher, MD (Gastroenterology)   No outpatient medications prior to visit.   No facility-administered medications prior to visit.    Review of Systems  Constitutional:  Negative for chills, fever and malaise/fatigue.  Eyes:  Negative for blurred vision and double vision.  Respiratory:  Negative for cough and shortness of breath.   Cardiovascular:  Negative for chest pain and palpitations.  Musculoskeletal:  Negative for joint pain and myalgias.  Neurological:  Negative for dizziness and headaches.  Psychiatric/Behavioral:  Negative for depression. The patient is not nervous/anxious.           Objective:     BP 94/61   Pulse 82   Temp 99 F (37.2 C)   Ht 5\' 6"  (1.676 m)   Wt 206 lb 9.6 oz (93.7 kg)   LMP 09/17/2012   SpO2 95%   BMI 33.35 kg/m   Physical Exam Constitutional:      Appearance: Normal appearance.  HENT:     Head: Normocephalic.     Mouth/Throat:     Mouth: Mucous membranes are moist.     Pharynx: Oropharynx is clear.  Eyes:     Extraocular  Movements: Extraocular movements intact.     Conjunctiva/sclera: Conjunctivae normal.  Cardiovascular:     Rate and Rhythm: Normal rate and regular rhythm.     Heart sounds: Normal heart sounds. No murmur heard.    No gallop.  Pulmonary:     Effort: Pulmonary effort is normal.     Breath sounds: Normal breath sounds. No wheezing, rhonchi or rales.  Musculoskeletal:     Right lower leg: No edema.     Left lower leg: No edema.  Skin:    General: Skin is warm and dry.  Neurological:     General: No focal deficit present.     Mental Status: She is alert and oriented to person, place, and time.  Psychiatric:        Mood and Affect: Mood normal.        Behavior: Behavior normal.      No results found for any visits on 05/21/23.    Assessment & Plan:    Routine Health Maintenance and Physical Exam  Health Maintenance  Topic Date Due   HIV Screening  Never done   Hepatitis C Screening  Never done   DTaP/Tdap/Td vaccine (1 - Tdap) Never done   Pap with  HPV screening  Never done   Mammogram  Never done   Zoster (Shingles) Vaccine (1 of 2) Never done   Colon Cancer Screening  09/06/2020   COVID-19 Vaccine (1 - 2024-25 season) Never done   Flu Shot  08/03/2023   HPV Vaccine  Aged Out   Meningitis B Vaccine  Aged Out    Discussed health benefits of physical activity, and encouraged her to engage in regular exercise appropriate for her age and condition.  Problem List Items Addressed This Visit   None Visit Diagnoses       Annual visit for general adult medical examination without abnormal findings    -  Primary     Screening for lipid disorders       Relevant Orders   Lipid Panel     Screening for thyroid disorder       Relevant Orders   TSH + free T4     BMI 33.0-33.9,adult       Relevant Orders   Lipid Panel   TSH + free T4      Adult wellness-complete.wellness physical was conducted today. Importance of diet and exercise were discussed in detail.  Importance of  stress reduction and healthy living were discussed.  In addition to this a discussion regarding safety was also covered.  We also reviewed over immunizations and gave recommendations regarding current immunization needed for age.   In addition to this additional areas were also touched on including: GI for BMs and colonoscopy Preventative health exams needed: mammogram order placed today, Pap offered today, deferred as she states she has had a Pap within the last 5 years.  Colonoscopy over due since 2022, referral placed on 03/13/2023. Patient to schedule.  Patient was advised yearly wellness exam   No follow-ups on file.     Jearlean Mince Skyeler Smola, PA-C

## 2023-08-03 ENCOUNTER — Other Ambulatory Visit: Payer: Self-pay | Admitting: *Deleted

## 2023-08-03 ENCOUNTER — Encounter: Payer: Self-pay | Admitting: Internal Medicine

## 2023-08-03 ENCOUNTER — Encounter: Payer: Self-pay | Admitting: *Deleted

## 2023-08-03 ENCOUNTER — Ambulatory Visit: Admitting: Internal Medicine

## 2023-08-03 VITALS — BP 118/71 | HR 99 | Temp 98.4°F | Ht 66.0 in | Wt 206.8 lb

## 2023-08-03 DIAGNOSIS — D329 Benign neoplasm of meninges, unspecified: Secondary | ICD-10-CM

## 2023-08-03 DIAGNOSIS — R194 Change in bowel habit: Secondary | ICD-10-CM

## 2023-08-03 DIAGNOSIS — Z860101 Personal history of adenomatous and serrated colon polyps: Secondary | ICD-10-CM

## 2023-08-03 DIAGNOSIS — R197 Diarrhea, unspecified: Secondary | ICD-10-CM

## 2023-08-03 DIAGNOSIS — K5900 Constipation, unspecified: Secondary | ICD-10-CM

## 2023-08-03 MED ORDER — NA SULFATE-K SULFATE-MG SULF 17.5-3.13-1.6 GM/177ML PO SOLN: ORAL | 0 refills | Status: DC

## 2023-08-03 NOTE — Progress Notes (Addendum)
 Primary Care Physician:  Grooms, Charmaine, [place] Primary Gastroenterologist:  Dr. Cindie  Chief Complaint  Patient presents with   Constipation    Pt arrives to discuss alternating constipation and diarrhea. Pt will go 2-3 days and no bowel movement, then the next day she will have to run to the bathroom. Has spasms at times, not painful. Last TCS 10 years ago. Pt states she does eat pretty healthy. Has tried Metamucil in the past and drinks warm lemon water .    HPI:   [name] is a 63 y.o. female who presents to the clinic today by referral from her PCP Charmaine Grooms for evaluation.  History of adenomatous colon polyps: Last colonoscopy 09/07/2010 with 4 mm polyp at the splenic flexure, tubular adenoma on pathology.  Denies any family history of colorectal malignancy.  No melena hematochezia.  No unintentional weight loss.  Change in bowels: Patient states she has had irregular bowel movements.  Will have constipation for 2 to 3 days and then she will have what she describes as colon spasms and urgency.  Will then have numerous bowel movements throughout the day, sometimes loose.  Sometimes stools are hard and painful to pass with associated straining.  Denies any upper GI symptoms including heartburn, reflux, dysphagia/odynophagia, epigastric or chest pain.  Incidentally while performing further chart review, she was diagnosed with a meningioma in 2015.  At that time she was a single mother with a 31 year old daughter and did not pursue any definitive treatment.  She did not pursue any further surveillance imaging either.  Denies any neuro symptoms for me today.  Past Medical History:  Diagnosis Date   Chronic abdominal pain    HELLP (hemolytic anemia/elev liver enzymes/low platelets in pregnancy)    Meningeal tumor    benign   S/P colonoscopy 2002, 2007   2002 Dr. Golda: small external hemorrhoids otherwise normal. 2007 Dr. Claudene: normal to cecum   S/P endoscopy 2007   Dr.  Keven Claudene: gastritis (reviewed records)    Past Surgical History:  Procedure Laterality Date   CESAREAN SECTION     CHOLECYSTECTOMY     COLONOSCOPY  09/07/2010   Procedure: COLONOSCOPY;  Surgeon: Lamar CHRISTELLA Hollingshead, MD;  [facility]: [facility];  Service: Endoscopy;  Laterality: N/A;  10:45   ESOPHAGOGASTRODUODENOSCOPY  09/07/2010   Procedure: ESOPHAGOGASTRODUODENOSCOPY (EGD);  Surgeon: Lamar CHRISTELLA Hollingshead, MD;  Location: [facility];  Service: Endoscopy;  Laterality: N/A;   exploratory laparoscopy     endometriosis   TONSILLECTOMY      No current outpatient medications on file.   No current facility-administered medications for this visit.    Allergies as of 08/03/2023   (No Known Allergies)    Family History  Problem Relation Age of Onset   Diabetes Mother    Coronary artery disease Mother    Cancer Father    Colon cancer Neg Hx     Social History   Socioeconomic History   Marital status: Married    Spouse name: Not on file   Number of children: Not on file   Years of education: Not on file   Highest education level: Not on file  Occupational History   Not on file  Tobacco Use   Smoking status: Never   Smokeless tobacco: Never  Substance and Sexual Activity   Alcohol use: No   Drug use: No   Sexual activity: Yes  Other Topics Concern   Not on file  Social History Narrative  Not on file   Social Drivers of Health   Financial Resource Strain: Not on file  Food Insecurity: Not on file  Transportation Needs: Not on file  Physical Activity: Not on file  Stress: Not on file  Social Connections: Not on file  Intimate Partner Violence: Not on file    Subjective: Review of Systems  Constitutional:  Negative for chills and fever.  HENT:  Negative for congestion and hearing loss.   Eyes:  Negative for blurred vision and double vision.  Respiratory:  Negative for cough and shortness of breath.   Cardiovascular:  Negative for chest pain and palpitations.   Gastrointestinal:  Negative for abdominal pain, blood in stool, constipation, diarrhea, heartburn, melena and vomiting.  Genitourinary:  Negative for dysuria and urgency.  Musculoskeletal:  Negative for joint pain and myalgias.  Skin:  Negative for itching and rash.  Neurological:  Negative for dizziness and headaches.  Psychiatric/Behavioral:  Negative for depression. The patient is not nervous/anxious.        Objective: BP 118/71   Pulse 99   Temp 98.4 F (36.9 C)   Ht 5' 6 (1.676 m)   Wt 206 lb 12.8 oz (93.8 kg)   LMP 09/17/2012   BMI 33.38 kg/m  Physical Exam Constitutional:      Appearance: Normal appearance.  HENT:     Head: Normocephalic and atraumatic.  Eyes:     Extraocular Movements: Extraocular movements intact.     Conjunctiva/sclera: Conjunctivae normal.  Cardiovascular:     Rate and Rhythm: Normal rate and regular rhythm.  Pulmonary:     Effort: Pulmonary effort is normal.     Breath sounds: Normal breath sounds.  Abdominal:     General: Bowel sounds are normal.     Palpations: Abdomen is soft.  Musculoskeletal:        General: No swelling. Normal range of motion.     Cervical back: Normal range of motion and neck supple.  Skin:    General: Skin is warm and dry.     Coloration: Skin is not jaundiced.  Neurological:     General: No focal deficit present.     Mental Status: She is alert and oriented to person, place, and time.  Psychiatric:        Mood and Affect: Mood normal.        Behavior: Behavior normal.      Assessment/Plan:  1.  Personal history of adenomatous colon polyps-Will schedule for surveillance colonoscopy.The risks including infection, bleed, or perforation as well as benefits, limitations, alternatives and imponderables have been reviewed with the patient. Questions have been answered. All parties agreeable.  2.  Change in bowels-patient's symptoms consistent with constipation with overflow diarrhea. I recommended taking  MiraLAX 1 capful daily.  If this is not adequate then increase to 2 capfuls daily.  If this is still not adequate then add on once daily Dulcolax.  Encouraged to drink at least 6 glasses of water  daily.  Consider trial of Linzess if symptoms not improved.  3.  Meningioma-discussed with patient today.  Has not had any imaging since 2015. Will refer to neurology.    Thank you Charmaine Grooms for the kind referral.  08/03/2023 9:27 AM   Disclaimer: This note was dictated with voice recognition software. Similar sounding words can inadvertently be transcribed and may not be corrected upon review.

## 2023-08-03 NOTE — H&P (View-Only) (Signed)
 Primary Care Physician:  Grooms, Charmaine, [place] Primary Gastroenterologist:  Dr. Cindie  Chief Complaint  Patient presents with   Constipation    Pt arrives to discuss alternating constipation and diarrhea. Pt will go 2-3 days and no bowel movement, then the next day she will have to run to the bathroom. Has spasms at times, not painful. Last TCS 10 years ago. Pt states she does eat pretty healthy. Has tried Metamucil in the past and drinks warm lemon water .    HPI:   [name] is a 63 y.o. female who presents to the clinic today by referral from her PCP Charmaine Grooms for evaluation.  History of adenomatous colon polyps: Last colonoscopy 09/07/2010 with 4 mm polyp at the splenic flexure, tubular adenoma on pathology.  Denies any family history of colorectal malignancy.  No melena hematochezia.  No unintentional weight loss.  Change in bowels: Patient states she has had irregular bowel movements.  Will have constipation for 2 to 3 days and then she will have what she describes as colon spasms and urgency.  Will then have numerous bowel movements throughout the day, sometimes loose.  Sometimes stools are hard and painful to pass with associated straining.  Denies any upper GI symptoms including heartburn, reflux, dysphagia/odynophagia, epigastric or chest pain.  Incidentally while performing further chart review, she was diagnosed with a meningioma in 2015.  At that time she was a single mother with a 31 year old daughter and did not pursue any definitive treatment.  She did not pursue any further surveillance imaging either.  Denies any neuro symptoms for me today.  Past Medical History:  Diagnosis Date   Chronic abdominal pain    HELLP (hemolytic anemia/elev liver enzymes/low platelets in pregnancy)    Meningeal tumor    benign   S/P colonoscopy 2002, 2007   2002 Dr. Golda: small external hemorrhoids otherwise normal. 2007 Dr. Claudene: normal to cecum   S/P endoscopy 2007   Dr.  Keven Claudene: gastritis (reviewed records)    Past Surgical History:  Procedure Laterality Date   CESAREAN SECTION     CHOLECYSTECTOMY     COLONOSCOPY  09/07/2010   Procedure: COLONOSCOPY;  Surgeon: Lamar CHRISTELLA Hollingshead, MD;  [facility];  Service: Endoscopy;  Laterality: N/A;  10:45   ESOPHAGOGASTRODUODENOSCOPY  09/07/2010   Procedure: ESOPHAGOGASTRODUODENOSCOPY (EGD);  Surgeon: Lamar CHRISTELLA Hollingshead, MD;  [facility];  Service: Endoscopy;  Laterality: N/A;   exploratory laparoscopy     endometriosis   TONSILLECTOMY      No current outpatient medications on file.   No current facility-administered medications for this visit.    Allergies as of 08/03/2023   (No Known Allergies)    Family History  Problem Relation Age of Onset   Diabetes Mother    Coronary artery disease Mother    Cancer Father    Colon cancer Neg Hx     Social History   Socioeconomic History   Marital status: Married    Spouse name: Not on file   Number of children: Not on file   Years of education: Not on file   Highest education level: Not on file  Occupational History   Not on file  Tobacco Use   Smoking status: Never   Smokeless tobacco: Never  Substance and Sexual Activity   Alcohol use: No   Drug use: No   Sexual activity: Yes  Other Topics Concern   Not on file  Social History Narrative  Not on file   Social Drivers of Health   Financial Resource Strain: Not on file  Food Insecurity: Not on file  Transportation Needs: Not on file  Physical Activity: Not on file  Stress: Not on file  Social Connections: Not on file  Intimate Partner Violence: Not on file    Subjective: Review of Systems  Constitutional:  Negative for chills and fever.  HENT:  Negative for congestion and hearing loss.   Eyes:  Negative for blurred vision and double vision.  Respiratory:  Negative for cough and shortness of breath.   Cardiovascular:  Negative for chest pain and palpitations.   Gastrointestinal:  Negative for abdominal pain, blood in stool, constipation, diarrhea, heartburn, melena and vomiting.  Genitourinary:  Negative for dysuria and urgency.  Musculoskeletal:  Negative for joint pain and myalgias.  Skin:  Negative for itching and rash.  Neurological:  Negative for dizziness and headaches.  Psychiatric/Behavioral:  Negative for depression. The patient is not nervous/anxious.        Objective: BP 118/71   Pulse 99   Temp 98.4 F (36.9 C)   Ht 5' 6 (1.676 m)   Wt 206 lb 12.8 oz (93.8 kg)   LMP 09/17/2012   BMI 33.38 kg/m  Physical Exam Constitutional:      Appearance: Normal appearance.  HENT:     Head: Normocephalic and atraumatic.  Eyes:     Extraocular Movements: Extraocular movements intact.     Conjunctiva/sclera: Conjunctivae normal.  Cardiovascular:     Rate and Rhythm: Normal rate and regular rhythm.  Pulmonary:     Effort: Pulmonary effort is normal.     Breath sounds: Normal breath sounds.  Abdominal:     General: Bowel sounds are normal.     Palpations: Abdomen is soft.  Musculoskeletal:        General: No swelling. Normal range of motion.     Cervical back: Normal range of motion and neck supple.  Skin:    General: Skin is warm and dry.     Coloration: Skin is not jaundiced.  Neurological:     General: No focal deficit present.     Mental Status: She is alert and oriented to person, place, and time.  Psychiatric:        Mood and Affect: Mood normal.        Behavior: Behavior normal.      Assessment/Plan:  1.  Personal history of adenomatous colon polyps-Will schedule for surveillance colonoscopy.The risks including infection, bleed, or perforation as well as benefits, limitations, alternatives and imponderables have been reviewed with the patient. Questions have been answered. All parties agreeable.  2.  Change in bowels-patient's symptoms consistent with constipation with overflow diarrhea. I recommended taking  MiraLAX 1 capful daily.  If this is not adequate then increase to 2 capfuls daily.  If this is still not adequate then add on once daily Dulcolax.  Encouraged to drink at least 6 glasses of water  daily.  Consider trial of Linzess if symptoms not improved.  3.  Meningioma-discussed with patient today.  Has not had any imaging since 2015. Will refer to neurology.    Thank you Charmaine Grooms for the kind referral.  08/03/2023 9:27 AM   Disclaimer: This note was dictated with voice recognition software. Similar sounding words can inadvertently be transcribed and may not be corrected upon review.

## 2023-08-03 NOTE — Patient Instructions (Signed)
 We will schedule you for colonoscopy for colon cancer screening purposes.  For your constipation, I want you to start taking over the counter MiraLAX 1 capful daily.  If this does not adequately control your constipation, I would increase to 2 capfuls daily.  If this is still not adequate, then I would add on once daily Dulcolax (bisacodyl) tablet.  If you prefer a nonpowder formulation, would recommend Colace instead of the MiraLAX.  Be sure to drink at least 6 glasses of water  daily.   I have reached out to oncology in regards to your meningioma.  If they think you need to reestablish then I will place this referral.  It was very nice meeting you today.  Dr. Cindie

## 2023-08-22 ENCOUNTER — Encounter (HOSPITAL_COMMUNITY)
Admission: RE | Admit: 2023-08-22 | Discharge: 2023-08-22 | Disposition: A | Discharge status: Home / Self Care | Source: Ambulatory Visit | Attending: Internal Medicine | Admitting: Internal Medicine

## 2023-08-22 NOTE — Pre-Procedure Instructions (Signed)
 Attempted pre-op phonecall. Left VM for her to call us  back.

## 2023-08-23 ENCOUNTER — Encounter (HOSPITAL_COMMUNITY): Payer: Self-pay

## 2023-08-23 ENCOUNTER — Other Ambulatory Visit: Payer: Self-pay

## 2023-08-27 ENCOUNTER — Encounter (HOSPITAL_COMMUNITY)
Admission: RE | Disposition: A | Discharge status: Home / Self Care | Payer: Self-pay | Source: Home / Self Care | Attending: Internal Medicine

## 2023-08-27 ENCOUNTER — Ambulatory Visit (HOSPITAL_COMMUNITY): Admitting: Anesthesiology

## 2023-08-27 ENCOUNTER — Ambulatory Visit (HOSPITAL_COMMUNITY)
Admission: RE | Admit: 2023-08-27 | Discharge: 2023-08-27 | Disposition: A | Discharge status: Home / Self Care | Attending: Internal Medicine | Admitting: Internal Medicine

## 2023-08-27 ENCOUNTER — Other Ambulatory Visit: Payer: Self-pay

## 2023-08-27 ENCOUNTER — Encounter (HOSPITAL_COMMUNITY): Payer: Self-pay | Admitting: Internal Medicine

## 2023-08-27 DIAGNOSIS — I10 Essential (primary) hypertension: Secondary | ICD-10-CM | POA: Insufficient documentation

## 2023-08-27 DIAGNOSIS — D123 Benign neoplasm of transverse colon: Secondary | ICD-10-CM

## 2023-08-27 DIAGNOSIS — Z1211 Encounter for screening for malignant neoplasm of colon: Secondary | ICD-10-CM | POA: Diagnosis not present

## 2023-08-27 DIAGNOSIS — Z139 Encounter for screening, unspecified: Secondary | ICD-10-CM | POA: Diagnosis not present

## 2023-08-27 DIAGNOSIS — K648 Other hemorrhoids: Secondary | ICD-10-CM | POA: Diagnosis not present

## 2023-08-27 DIAGNOSIS — K635 Polyp of colon: Secondary | ICD-10-CM | POA: Diagnosis not present

## 2023-08-27 DIAGNOSIS — K6289 Other specified diseases of anus and rectum: Secondary | ICD-10-CM

## 2023-08-27 DIAGNOSIS — K5909 Other constipation: Secondary | ICD-10-CM | POA: Diagnosis not present

## 2023-08-27 DIAGNOSIS — D329 Benign neoplasm of meninges, unspecified: Secondary | ICD-10-CM | POA: Diagnosis not present

## 2023-08-27 HISTORY — PX: COLONOSCOPY: SHX5424

## 2023-08-27 SURGERY — COLONOSCOPY
Anesthesia: General

## 2023-08-27 MED ORDER — PROPOFOL 500 MG/50ML IV EMUL
INTRAVENOUS | Status: DC | PRN
  Administered 2023-08-27: 150 ug/kg/min via INTRAVENOUS

## 2023-08-27 MED ORDER — LACTATED RINGERS IV SOLN: INTRAVENOUS | Status: DC | PRN

## 2023-08-27 MED ORDER — LIDOCAINE 2% (20 MG/ML) 5 ML SYRINGE
INTRAMUSCULAR | Status: DC | PRN
  Administered 2023-08-27: 100 mg via INTRAVENOUS

## 2023-08-27 MED ORDER — PROPOFOL 10 MG/ML IV BOLUS
INTRAVENOUS | Status: DC | PRN
  Administered 2023-08-27: 50 mg via INTRAVENOUS

## 2023-08-27 NOTE — Discharge Instructions (Signed)
  Colonoscopy Discharge Instructions  Read the instructions outlined below and refer to this sheet in the next few weeks. These discharge instructions provide you with general information on caring for yourself after you leave the hospital. Your doctor may also give you specific instructions. While your treatment has been planned according to the most current medical practices available, unavoidable complications occasionally occur.   ACTIVITY You may resume your regular activity, but move at a slower pace for the next 24 hours.  Take frequent rest periods for the next 24 hours.  Walking will help get rid of the air and reduce the bloated feeling in your belly (abdomen).  No driving for 24 hours (because of the medicine (anesthesia) used during the test).   Do not sign any important legal documents or operate any machinery for 24 hours (because of the anesthesia used during the test).  NUTRITION Drink plenty of fluids.  You may resume your normal diet as instructed by your doctor.  Begin with a light meal and progress to your normal diet. Heavy or fried foods are harder to digest and may make you feel sick to your stomach (nauseated).  Avoid alcoholic beverages for 24 hours or as instructed.  MEDICATIONS You may resume your normal medications unless your doctor tells you otherwise.  WHAT YOU CAN EXPECT TODAY Some feelings of bloating in the abdomen.  Passage of more gas than usual.  Spotting of blood in your stool or on the toilet paper.  IF YOU HAD POLYPS REMOVED DURING THE COLONOSCOPY: No aspirin products for 7 days or as instructed.  No alcohol for 7 days or as instructed.  Eat a soft diet for the next 24 hours.  FINDING OUT THE RESULTS OF YOUR TEST Not all test results are available during your visit. If your test results are not back during the visit, make an appointment with your caregiver to find out the results. Do not assume everything is normal if you have not heard from your  caregiver or the medical facility. It is important for you to follow up on all of your test results.  SEEK IMMEDIATE MEDICAL ATTENTION IF: You have more than a spotting of blood in your stool.  Your belly is swollen (abdominal distention).  You are nauseated or vomiting.  You have a temperature over 101.  You have abdominal pain or discomfort that is severe or gets worse throughout the day.   Your colonoscopy revealed 1 polyp(s) which I removed successfully. Await pathology results, my office will contact you. I recommend repeating colonoscopy in 7 years for surveillance purposes.   Follow up in GI office in 3 months  I hope you have a great rest of your week!  [name]. Cindie, D.O. Gastroenterology and Hepatology [facility]

## 2023-08-27 NOTE — Op Note (Signed)
 [facility] Patient Name: [name] Procedure Date: 08/27/2023 11:01 AM MRN: [id number] Date of Birth: 04-07-1960 Attending MD: Carlin POUR. Cindie , [place], [id number] CSN: [id number] Age: 63 Admit Type: Outpatient Procedure:                Colonoscopy Indications:              Screening for colorectal malignant neoplasm Providers:                Carlin POUR. Cindie, DO, [place], [place],                            Technician Referring MD:              Medicines:                See the Anesthesia note for documentation of the                            administered medications Complications:            No immediate complications. Estimated Blood Loss:     Estimated blood loss was minimal. Procedure:                Pre-Anesthesia Assessment:                           - The anesthesia plan was to use monitored                            anesthesia care (MAC).                           After obtaining informed consent, the colonoscope                            was passed under direct vision. Throughout the                            procedure, the patient's blood pressure, pulse, and                            oxygen saturations were monitored continuously. The                            PCF-HQ190L ([id number]) Peds Colon was introduced                            through the anus and advanced to the the cecum,                            identified by appendiceal orifice and ileocecal                            valve. The colonoscopy was performed without                            difficulty. The patient tolerated the procedure  well. The quality of the bowel preparation was                            evaluated using the BBPS [facility] Bowel Preparation                            Scale) with scores of: Right Colon = 3, Transverse                            Colon = 3 and Left Colon = 3 (entire mucosa seen                            well with no residual  staining, small fragments of                            stool or opaque liquid). The total BBPS score                            equals 9. Scope In: 11:19:43 AM Scope Out: 11:31:35 AM Scope Withdrawal Time: 0 hours 8 minutes 16 seconds  Total Procedure Duration: 0 hours 11 minutes 52 seconds  Findings:      Non-bleeding internal hemorrhoids were found during retroflexion.      Anal papilla(e) were hypertrophied.      An 8 mm polyp was found in the transverse colon. The polyp was sessile.       The polyp was removed with a cold snare. Resection and retrieval were       complete.      The exam was otherwise without abnormality. Impression:               - Non-bleeding internal hemorrhoids.                           - Anal papilla(e) were hypertrophied.                           - One 8 mm polyp in the transverse colon, removed                            with a cold snare. Resected and retrieved.                           - The examination was otherwise normal. Moderate Sedation:      Per Anesthesia Care Recommendation:           - Patient has a contact number available for                            emergencies. The signs and symptoms of potential                            delayed complications were discussed with the                            patient. Return to normal  activities tomorrow.                            Written discharge instructions were provided to the                            patient.                           - Resume previous diet.                           - Continue present medications.                           - Await pathology results.                           - Repeat colonoscopy in 7 years for surveillance.                           - Return to GI clinic in 3 months. Procedure Code(s):        --- Professional ---                           [phone], Colonoscopy, flexible; with removal of                            tumor(s), polyp(s), or other lesion(s) by  snare                            technique Diagnosis Code(s):        --- Professional ---                           Z12.11, Encounter for screening for malignant                            neoplasm of colon                           K62.89, Other specified diseases of anus and rectum                           D12.3, Benign neoplasm of transverse colon (hepatic                            flexure or splenic flexure)                           K64.8, Other hemorrhoids CPT copyright 2022 American Medical Association. All rights reserved. The codes documented in this report are preliminary and upon coder review may  be revised to meet current compliance requirements. Carlin POUR. Cindie, DO Carlin POUR. Cindie, DO 08/27/2023 11:34:11 AM This report has been signed electronically. Number of Addenda: 0

## 2023-08-27 NOTE — Interval H&P Note (Signed)
 History and Physical Interval Note:  08/27/2023 10:49 AM  [name]  has presented today for surgery, with the diagnosis of SCREENING.  The various methods of treatment have been discussed with the patient and family. After consideration of risks, benefits and other options for treatment, the patient has consented to  Procedure(s) with comments: COLONOSCOPY (N/A) - 10:45 AM, ASA 2 as a surgical intervention.  The patient's history has been reviewed, patient examined, no change in status, stable for surgery.  I have reviewed the patient's chart and labs.  Questions were answered to the patient's satisfaction.     Carlin MARLA Hasty

## 2023-08-27 NOTE — Anesthesia Preprocedure Evaluation (Signed)

## 2023-08-27 NOTE — Transfer of Care (Signed)
 Immediate Anesthesia Transfer of Care Note  Patient: [name]  Procedure(s) Performed: COLONOSCOPY  Patient Location: Short Stay  Anesthesia Type:MAC  Level of Consciousness: drowsy  Airway & Oxygen Therapy: Patient Spontanous Breathing  Post-op Assessment: Report given to RN and Post -op Vital signs reviewed and stable  Post vital signs: Reviewed and stable  Last Vitals:  Vitals Value Taken Time  BP 96/61 08/27/23 11:40  Temp    Pulse 87 08/27/23 11:39  Resp 18 08/27/23 11:39  SpO2 100% on RA 08/27/23 11:39    Last Pain:  Vitals:   08/27/23 1115  TempSrc:   PainSc: 0-No pain         Complications: No notable events documented.

## 2023-08-28 LAB — SURGICAL PATHOLOGY

## 2023-08-30 ENCOUNTER — Encounter (HOSPITAL_COMMUNITY): Payer: Self-pay | Admitting: Internal Medicine

## 2023-09-01 NOTE — Anesthesia Postprocedure Evaluation (Signed)
 Anesthesia Post Note  Patient: [name]  Procedure(s) Performed: COLONOSCOPY  Patient location during evaluation: Phase II Anesthesia Type: General Level of consciousness: awake Pain management: pain level controlled Vital Signs Assessment: post-procedure vital signs reviewed and stable Respiratory status: spontaneous breathing and respiratory function stable Cardiovascular status: blood pressure returned to baseline and stable Postop Assessment: no headache and no apparent nausea or vomiting Anesthetic complications: no Comments: Late entry   No notable events documented.   Last Vitals:  Vitals:   08/27/23 1142 08/27/23 1144  BP: 96/61 94/62  Pulse:    Resp:    Temp:    SpO2: 100%     Last Pain:  Vitals:   08/27/23 1138  TempSrc: Oral  PainSc:                  Yvonna JINNY Bosworth

## 2023-09-04 ENCOUNTER — Ambulatory Visit: Payer: Self-pay | Admitting: Internal Medicine

## 2023-10-17 ENCOUNTER — Encounter: Payer: Self-pay | Admitting: Internal Medicine

## 2023-10-24 ENCOUNTER — Ambulatory Visit: Payer: Self-pay

## 2023-10-24 NOTE — Telephone Encounter (Signed)
 FYI Only or Action Required?: FYI only for provider.  Patient was last seen in primary care on 05/21/2023 by Grooms, [place], [place].  Called Nurse Triage reporting Dizziness.  Symptoms began several weeks ago.  Interventions attempted: Rest, hydration, or home remedies.  Symptoms are: unchanged.  Triage Disposition: See Physician Within 24 Hours  Patient/caregiver understands and will follow disposition?: Yes, but will wait     Copied from CRM [id number]. Topic: Clinical - Red Word Triage >> Oct 24, 2023  2:08 PM Rosaria BRAVO wrote: Red Word that prompted transfer to Nurse Triage: dizziness having trouble sleeping    ----------------------------------------------------------------------- From previous Reason for Contact - Scheduling: Patient/patient representative is calling to schedule an appointment. Refer to attachments for appointment information. Reason for Disposition  [1] NO dizziness now AND [2] age > 5  Answer Assessment - Initial Assessment Questions Additional info: Scheduled next available acute visit with pcp. Patient will call back for any new, worsening symptoms, or if vertigo becomes constant, more frequency, or more than mild.    1. DESCRIPTION: Describe your dizziness.     Loses balance  2. VERTIGO: Do you feel like either you or the room is spinning or tilting?      Room spinning intermittently 3. LIGHTHEADED: Do you feel lightheaded? (e.g., somewhat faint, woozy, weak upon standing)     no 4. SEVERITY: How bad is it?  Can you walk?     Walking normally but feels off balance 5. ONSET:  When did the dizziness begin?     About 3 weeks ago, intermittent mild 6. AGGRAVATING FACTORS: Does anything make it worse? (e.g., standing, change in head position)     Not identified 7. CAUSE: What do you think is causing the dizziness?     unsure 8. RECURRENT SYMPTOM: Have you had dizziness before? If Yes, ask: When was the last time? What  happened that time?     Once with a cold. No illness symptoms currently 9. OTHER SYMPTOMS: Do you have any other symptoms? (e.g., earache, headache, numbness, tinnitus, vomiting, weakness)     Denies all other symptoms. Currently asymptomatic.  Protocols used: Dizziness - Vertigo-A-AH

## 2023-10-26 ENCOUNTER — Ambulatory Visit: Payer: Self-pay | Admitting: Physician Assistant

## 2023-10-26 ENCOUNTER — Encounter: Payer: Self-pay | Admitting: Physician Assistant

## 2023-10-26 VITALS — BP 109/76 | HR 103 | Temp 98.0°F | Ht 66.0 in | Wt 204.8 lb

## 2023-10-26 DIAGNOSIS — Z6833 Body mass index (BMI) 33.0-33.9, adult: Secondary | ICD-10-CM | POA: Diagnosis not present

## 2023-10-26 DIAGNOSIS — R42 Dizziness and giddiness: Secondary | ICD-10-CM

## 2023-10-26 DIAGNOSIS — F411 Generalized anxiety disorder: Secondary | ICD-10-CM

## 2023-10-26 DIAGNOSIS — Z1329 Encounter for screening for other suspected endocrine disorder: Secondary | ICD-10-CM | POA: Diagnosis not present

## 2023-10-26 MED ORDER — MECLIZINE HCL 25 MG PO TABS: 25.0000 mg | ORAL_TABLET | Freq: Three times a day (TID) | ORAL | 0 refills | Status: AC | PRN

## 2023-10-26 NOTE — Assessment & Plan Note (Signed)
 Dizziness for three weeks, possibly related to anxiety and stress. No significant neuro findings on exam.  - Lab work today including: glucose, electrolytes, CBC. - Prescribe meclizine as needed, up to three times daily for dizziness. - Consider vestibular rehabilitation if symptoms persist. Patient to message via MyChart or call clinic for persistent symptoms.  - Provided information on dizziness management. - Warning signs and follow up precautions discussed.

## 2023-10-26 NOTE — Assessment & Plan Note (Signed)
 Anxiety exacerbated by retirement, work stress, and family health issues. Prefers therapy over medication. - Refer to therapy for anxiety management. - Discuss short-term medication if anxiety persists after therapy. - Encourage follow-up if anxiety persists.

## 2023-10-26 NOTE — Progress Notes (Signed)
 Acute Office Visit  Subjective:     Patient ID: [name], female    DOB: 1960-05-30, 63 y.o.   MRN: [id number]   Discussed the use of AI scribe software for clinical note transcription with the patient, who gave verbal consent to proceed.  History of Present Illness [name] is a 63 year old female who presents with dizziness and anxiety.  Dizziness has been present for three weeks, described as a sensation of 'the world tilting'. It occurs unpredictably, both when moving and stationary. Occasionally, she can anticipate an episode and remains still with eyes closed until it passes. Episodes have increased in frequency, and she recently fell without injury. She has a history of vertigo, usually linked to sinus congestion or a cold, but currently lacks these symptoms.  Significant anxiety is attributed to upcoming retirement and related stressors. She works long hours, has insufficient time off, and experiences poor sleep, which began before the dizziness. She feels workplace pressure as they are reluctant to let her leave. She plans to retire by the end of the year and has a year's worth of sick leave she wishes to use. She has never taken medication for anxiety and is considering therapy, though she is concerned about therapist reliability. She believes time off and talking to someone might help manage her anxiety and improve sleep.   Review of Systems  Constitutional:  Negative for fever, malaise/fatigue and weight loss.  Respiratory:  Negative for cough and shortness of breath.   Cardiovascular:  Negative for chest pain and palpitations.  Musculoskeletal:  Positive for falls.  Neurological:  Positive for dizziness. Negative for headaches.  Psychiatric/Behavioral:  The patient is nervous/anxious and has insomnia.         Objective:     BP 109/76 (BP Location: Right Arm, Patient Position: Sitting, Cuff Size: Large)   Pulse (!) 103   Temp 98 F (36.7 C)   Ht 5' 6  (1.676 m)   Wt 204 lb 12 oz (92.9 kg)   LMP 09/17/2012   SpO2 98%   BMI 33.05 kg/m   Physical Exam Constitutional:      General: She is not in acute distress.    Appearance: Normal appearance. She is obese. She is not ill-appearing.  HENT:     Head: Normocephalic and atraumatic.     Right Ear: Tympanic membrane normal.     Left Ear: Tympanic membrane normal.     Mouth/Throat:     Mouth: Mucous membranes are moist.     Pharynx: Oropharynx is clear.  Eyes:     Extraocular Movements: Extraocular movements intact.     Conjunctiva/sclera: Conjunctivae normal.     Pupils: Pupils are equal, round, and reactive to light.  Cardiovascular:     Rate and Rhythm: Normal rate and regular rhythm.     Heart sounds: Normal heart sounds. No murmur heard. Pulmonary:     Effort: Pulmonary effort is normal.     Breath sounds: Normal breath sounds.  Skin:    General: Skin is warm and dry.  Neurological:     General: No focal deficit present.     Mental Status: She is alert and oriented to person, place, and time.  Psychiatric:        Mood and Affect: Mood is anxious.        Behavior: Behavior normal.     No results found for any visits on 10/26/23.      Assessment & Plan:  Generalized anxiety disorder Assessment & Plan: Anxiety exacerbated by retirement, work stress, and family health issues. Prefers therapy over medication. - Refer to therapy for anxiety management. - Discuss short-term medication if anxiety persists after therapy. - Encourage follow-up if anxiety persists.  Orders: -     Amb ref to [facility]  Vertigo Assessment & Plan: Dizziness for three weeks, possibly related to anxiety and stress. No significant neuro findings on exam.  - Lab work today including: glucose, electrolytes, CBC. - Prescribe meclizine as needed, up to three times daily for dizziness. - Consider vestibular rehabilitation if symptoms persist. Patient to message via MyChart or  call clinic for persistent symptoms.  - Provided information on dizziness management. - Warning signs and follow up precautions discussed.   Orders: -     CBC with Differential/Platelet -     Comprehensive metabolic panel with GFR -     Meclizine HCl; Take 1 tablet (25 mg total) by mouth 3 (three) times daily as needed for dizziness.  Dispense: 30 tablet; Refill: 0     Return if symptoms worsen or fail to improve.  Charmaine Princeton Nabor, PA-C

## 2023-10-27 LAB — CBC WITH DIFFERENTIAL/PLATELET
Basophils Absolute: 0.1 x10E3/uL (ref 0.0–0.2)
Basos: 1 %
EOS (ABSOLUTE): 0.1 x10E3/uL (ref 0.0–0.4)
Eos: 2 %
Hematocrit: 41.3 % (ref 34.0–46.6)
Hemoglobin: 13.8 g/dL (ref 11.1–15.9)
Immature Grans (Abs): 0 x10E3/uL (ref 0.0–0.1)
Immature Granulocytes: 0 %
Lymphocytes Absolute: 1.4 x10E3/uL (ref 0.7–3.1)
Lymphs: 32 %
MCH: 31.2 pg (ref 26.6–33.0)
MCHC: 33.4 g/dL (ref 31.5–35.7)
MCV: 93 fL (ref 79–97)
Monocytes Absolute: 0.3 x10E3/uL (ref 0.1–0.9)
Monocytes: 6 %
Neutrophils Absolute: 2.7 x10E3/uL (ref 1.4–7.0)
Neutrophils: 59 %
Platelets: 254 x10E3/uL (ref 150–450)
RBC: 4.42 x10E6/uL (ref 3.77–5.28)
RDW: 12.5 % (ref 11.7–15.4)
WBC: 4.6 x10E3/uL (ref 3.4–10.8)

## 2023-10-27 LAB — COMPREHENSIVE METABOLIC PANEL WITH GFR
ALT: 22 IU/L (ref 0–32)
AST: 17 IU/L (ref 0–40)
Albumin: 4.4 g/dL (ref 3.9–4.9)
Alkaline Phosphatase: 87 IU/L (ref 49–135)
BUN/Creatinine Ratio: 11 — ABNORMAL LOW (ref 12–28)
BUN: 8 mg/dL (ref 8–27)
Bilirubin Total: 0.4 mg/dL (ref 0.0–1.2)
CO2: 23 mmol/L (ref 20–29)
Calcium: 9.2 mg/dL (ref 8.7–10.3)
Chloride: 106 mmol/L (ref 96–106)
Creatinine, Ser: 0.75 mg/dL (ref 0.57–1.00)
Globulin, Total: 1.8 g/dL (ref 1.5–4.5)
Glucose: 83 mg/dL (ref 70–99)
Potassium: 4.3 mmol/L (ref 3.5–5.2)
Sodium: 143 mmol/L (ref 134–144)
Total Protein: 6.2 g/dL (ref 6.0–8.5)
eGFR: 90 mL/min/1.73 (ref >59)

## 2023-10-27 LAB — LIPID PANEL
Chol/HDL Ratio: 4.4 ratio (ref 0.0–4.4)
Cholesterol, Total: 185 mg/dL (ref 100–199)
HDL: 42 mg/dL (ref >39)
LDL Chol Calc (NIH): 125 mg/dL — ABNORMAL HIGH (ref 0–99)
Triglycerides: 96 mg/dL (ref 0–149)
VLDL Cholesterol Cal: 18 mg/dL (ref 5–40)

## 2023-10-27 LAB — TSH+FREE T4
Free T4: 1.1 ng/dL (ref 0.82–1.77)
TSH: 3.49 u[IU]/mL (ref 0.450–4.500)

## 2023-10-29 ENCOUNTER — Ambulatory Visit: Payer: Self-pay | Admitting: Physician Assistant

## 2023-11-21 DIAGNOSIS — F331 Major depressive disorder, recurrent, moderate: Secondary | ICD-10-CM | POA: Diagnosis not present

## 2023-12-05 DIAGNOSIS — F331 Major depressive disorder, recurrent, moderate: Secondary | ICD-10-CM | POA: Diagnosis not present

## 2023-12-07 ENCOUNTER — Institutional Professional Consult (permissible substitution): Payer: Self-pay | Admitting: Professional Counselor

## 2024-01-14 ENCOUNTER — Encounter: Payer: Self-pay | Admitting: Diagnostic Neuroimaging

## 2024-01-14 ENCOUNTER — Ambulatory Visit: Admitting: Diagnostic Neuroimaging

## 2024-01-14 VITALS — BP 97/76 | HR 96 | Ht 66.0 in | Wt 202.4 lb

## 2024-01-14 DIAGNOSIS — D329 Benign neoplasm of meninges, unspecified: Secondary | ICD-10-CM | POA: Insufficient documentation

## 2024-01-14 NOTE — Progress Notes (Signed)
 "  [facility]  PATIENT: [name] DOB: 05/30/1960  REFERRING CLINICIAN: Cindie Carlin POUR, DO HISTORY FROM: patient  REASON FOR VISIT: new consult   HISTORICAL  CHIEF COMPLAINT:  Chief Complaint  Patient presents with   RM 7     Patient is here alone for Meningioma, monitoring - no concerns     HISTORY OF PRESENT ILLNESS:   64 year old female here for evaluation of meningioma follow-up.  In 2015 patient had episode of vision loss lasting for 20 minutes and then resolving.  She had MRI of the brain and was found to have an incidental right frontal meningioma.  This was followed up later in 2015.  She saw 2 neurosurgeons who discussed options for watchful waiting versus surgical resection.  She opted to wait as she did not have any symptoms related to the meningioma.  Since that time patient has done well with no further symptoms.  However recently she had some dizziness spells, well treated with meclizine .  However this reminded her of her meningioma history and she wanted follow-up study.   REVIEW OF SYSTEMS: Full 14 system review of systems performed and negative with exception of: as per HPI.  ALLERGIES: Allergies[1]  HOME MEDICATIONS: Outpatient Medications Prior to Visit  Medication Sig Dispense Refill   meclizine  (ANTIVERT ) 25 MG tablet Take 1 tablet (25 mg total) by mouth 3 (three) times daily as needed for dizziness. (Patient not taking: Reported on 01/14/2024) 30 tablet 0   No facility-administered medications prior to visit.    PAST MEDICAL HISTORY: Past Medical History:  Diagnosis Date   Chronic abdominal pain    HELLP (hemolytic anemia/elev liver enzymes/low platelets in pregnancy)    Meningeal tumor    benign   S/P colonoscopy 2002, 2007   2002 Dr. Golda: small external hemorrhoids otherwise normal. 2007 Dr. Claudene: normal to cecum   S/P endoscopy 2007   Dr. Keven Claudene: gastritis (reviewed records)    PAST SURGICAL  HISTORY: Past Surgical History:  Procedure Laterality Date   CESAREAN SECTION     CHOLECYSTECTOMY     COLONOSCOPY  09/07/2010   Procedure: COLONOSCOPY;  Surgeon: Lamar CHRISTELLA Hollingshead, MD;  [facility]: [facility];  Service: Endoscopy;  Laterality: N/A;  10:45   COLONOSCOPY N/A 08/27/2023   Procedure: COLONOSCOPY;  Surgeon: Cindie Carlin POUR, DO;  Location: [facility];  Service: Endoscopy;  Laterality: N/A;  10:45 AM, ASA 2   ESOPHAGOGASTRODUODENOSCOPY  09/07/2010   Procedure: ESOPHAGOGASTRODUODENOSCOPY (EGD);  Surgeon: Lamar CHRISTELLA Hollingshead, MD;  Location: [facility];  Service: Endoscopy;  Laterality: N/A;   exploratory laparoscopy     endometriosis   TONSILLECTOMY      FAMILY HISTORY: Family History  Problem Relation Age of Onset   Diabetes Mother    Coronary artery disease Mother    Sleep apnea Father    Cancer Father    Colon cancer Neg Hx    Migraines Neg Hx    Seizures Neg Hx    Stroke Neg Hx     SOCIAL HISTORY: Social History   Socioeconomic History   Marital status: Married    Spouse name: Not on file   Number of children: Not on file   Years of education: Not on file   Highest education level: Not on file  Occupational History   Not on file  Tobacco Use   Smoking status: Never   Smokeless tobacco: Current    Types: Snuff  Vaping Use   Vaping  status: Never Used  Substance and Sexual Activity   Alcohol use: No   Drug use: No   Sexual activity: Yes  Other Topics Concern   Not on file  Social History Narrative   Coffee in the mornings 1 mug and 2 sodas daily - Diet dr.pepper    Social Drivers of Health   Tobacco Use: High Risk (10/26/2023)   Patient History    Smoking Tobacco Use: Never    Smokeless Tobacco Use: Current    Passive Exposure: Not on file  Financial Resource Strain: Not on file  Food Insecurity: Not on file  Transportation Needs: Not on file  Physical Activity: Not on file  Stress: Not on file  Social Connections: Not on file  Intimate Partner  Violence: Not on file  Depression (PHQ2-9): Low Risk (05/21/2023)   Depression (PHQ2-9)    PHQ-2 Score: 2  Alcohol Screen: Not on file  Housing: Not on file  Utilities: Not on file  Health Literacy: Not on file     PHYSICAL EXAM  GENERAL EXAM/CONSTITUTIONAL: Vitals:  Vitals:   01/14/24 1112  BP: 97/76  Pulse: 96  SpO2: 98%  Weight: 202 lb 6.4 oz (91.8 kg)  Height: 5' 6 (1.676 m)   Body mass index is 32.67 kg/m. Wt Readings from Last 3 Encounters:  01/14/24 202 lb 6.4 oz (91.8 kg)  10/26/23 204 lb 12 oz (92.9 kg)  08/27/23 206 lb 12.7 oz (93.8 kg)   Patient is in no distress; well developed, nourished and groomed; neck is supple  CARDIOVASCULAR: Examination of carotid arteries is normal; no carotid bruits Regular rate and rhythm, no murmurs Examination of peripheral vascular system by observation and palpation is normal  EYES: Ophthalmoscopic exam of optic discs and posterior segments is normal; no papilledema or hemorrhages No results found.  MUSCULOSKELETAL: Gait, strength, tone, movements noted in Neurologic exam below  NEUROLOGIC: MENTAL STATUS:      No data to display         awake, alert, oriented to person, place and time recent and remote memory intact normal attention and concentration language fluent, comprehension intact, naming intact fund of knowledge appropriate  CRANIAL NERVE:  2nd - no papilledema on fundoscopic exam 2nd, 3rd, 4th, 6th - pupils equal and reactive to light, visual fields full to confrontation, extraocular muscles intact, no nystagmus 5th - facial sensation symmetric 7th - facial strength symmetric 8th - hearing intact 9th - palate elevates symmetrically, uvula midline 11th - shoulder shrug symmetric 12th - tongue protrusion midline  MOTOR:  normal bulk and tone, full strength in the BUE, BLE  SENSORY:  normal and symmetric to light touch, temperature, vibration  COORDINATION:  finger-nose-finger, fine finger  movements normal  REFLEXES:  deep tendon reflexes 1+ and symmetric  GAIT/STATION:  narrow based gait     DIAGNOSTIC DATA (LABS, IMAGING, TESTING) - I reviewed patient records, labs, notes, testing and imaging myself where available.  Lab Results  Component Value Date   WBC 4.6 10/26/2023   HGB 13.8 10/26/2023   HCT 41.3 10/26/2023   MCV 93 10/26/2023   PLT 254 10/26/2023      Component Value Date/Time   NA 143 10/26/2023 0936   NA 141 03/04/2013 2201   K 4.3 10/26/2023 0936   K 3.5 03/04/2013 2201   CL 106 10/26/2023 0936   CL 107 03/04/2013 2201   CO2 23 10/26/2023 0936   CO2 29 03/04/2013 2201   GLUCOSE 83 10/26/2023 0936  GLUCOSE 98 10/22/2018 2030   GLUCOSE 73 03/04/2013 2201   BUN 8 10/26/2023 0936   BUN 12 03/04/2013 2201   CREATININE 0.75 10/26/2023 0936   CREATININE 0.70 03/04/2013 2201   CALCIUM 9.2 10/26/2023 0936   CALCIUM 8.7 03/04/2013 2201   PROT 6.2 10/26/2023 0936   ALBUMIN 4.4 10/26/2023 0936   AST 17 10/26/2023 0936   ALT 22 10/26/2023 0936   ALKPHOS 87 10/26/2023 0936   BILITOT 0.4 10/26/2023 0936   GFRNONAA >60 10/22/2018 2030   GFRNONAA >60 03/04/2013 2201   GFRAA >60 10/22/2018 2030   GFRAA >60 03/04/2013 2201   Lab Results  Component Value Date   CHOL 185 10/26/2023   HDL 42 10/26/2023   LDLCALC 125 (H) 10/26/2023   TRIG 96 10/26/2023   CHOLHDL 4.4 10/26/2023   No results found for: HGBA1C No results found for: VITAMINB12 Lab Results  Component Value Date   TSH 3.490 10/26/2023    03/06/13 MRI brain w/wo 1. No evidence of acute intracranial abnormality.  2. Mild chronic small vessel ischemic disease.  3. 2.1 cm right frontal meningioma.   12/17/13 MRI brain w/wo [I reviewed images myself and agree with interpretation. -VRP]  1. Stable anterior right frontal lobe meningioma. 2. Minimal white matter changes. 3. No acute intracranial abnormality or significant interval change.    ASSESSMENT AND PLAN  64 y.o. year  old female here with:   Dx:  1. Meningioma (HCC)      PLAN:  INCIDENTAL RIGHT FRONTAL MENINGIOMA (found in 2015) - follow up MRI brain  Orders Placed This Encounter  Procedures   MR BRAIN W WO CONTRAST   Return for pending if symptoms worsen or fail to improve, pending test results.    EDUARD FABIENE HANLON, MD 01/14/2024, 11:51 AM Certified in Neurology, Neurophysiology and Neuroimaging  [facility] [address] [place], [place] [address] [phone]     [1] No Known Allergies  "

## 2024-01-15 ENCOUNTER — Ambulatory Visit: Payer: Self-pay | Admitting: Diagnostic Neuroimaging

## 2024-01-15 ENCOUNTER — Other Ambulatory Visit

## 2024-01-15 DIAGNOSIS — D329 Benign neoplasm of meninges, unspecified: Secondary | ICD-10-CM | POA: Diagnosis not present

## 2024-01-15 MED ORDER — GADOBENATE DIMEGLUMINE 529 MG/ML IV SOLN
20.0000 mL | Freq: Once | INTRAVENOUS | Status: AC | PRN
  Administered 2024-01-15: 20 mL via INTRAVENOUS
